# Patient Record
Sex: Male | Born: 1975 | Race: White | Hispanic: No | Marital: Married | Smoking: Never smoker
Health system: Southern US, Community
[De-identification: ages and names within clinical notes are randomized; demographics above are authoritative.]

## PROBLEM LIST (undated history)

## (undated) DIAGNOSIS — K219 Gastro-esophageal reflux disease without esophagitis: Secondary | ICD-10-CM

## (undated) HISTORY — DX: Gastro-esophageal reflux disease without esophagitis: K21.9

---

## 2015-03-03 ENCOUNTER — Emergency Department (HOSPITAL_BASED_OUTPATIENT_CLINIC_OR_DEPARTMENT_OTHER)
Admission: EM | Admit: 2015-03-03 | Discharge: 2015-03-03 | Disposition: A | Discharge status: Home / Self Care | Payer: BLUE CROSS/BLUE SHIELD | Attending: Emergency Medicine | Admitting: Emergency Medicine

## 2015-03-03 ENCOUNTER — Encounter (HOSPITAL_BASED_OUTPATIENT_CLINIC_OR_DEPARTMENT_OTHER): Payer: Self-pay | Admitting: *Deleted

## 2015-03-03 ENCOUNTER — Emergency Department (HOSPITAL_BASED_OUTPATIENT_CLINIC_OR_DEPARTMENT_OTHER): Payer: BLUE CROSS/BLUE SHIELD

## 2015-03-03 DIAGNOSIS — F17200 Nicotine dependence, unspecified, uncomplicated: Secondary | ICD-10-CM | POA: Insufficient documentation

## 2015-03-03 DIAGNOSIS — J069 Acute upper respiratory infection, unspecified: Secondary | ICD-10-CM | POA: Insufficient documentation

## 2015-03-03 DIAGNOSIS — B9789 Other viral agents as the cause of diseases classified elsewhere: Secondary | ICD-10-CM

## 2015-03-03 DIAGNOSIS — R05 Cough: Secondary | ICD-10-CM | POA: Diagnosis present

## 2015-03-03 DIAGNOSIS — R197 Diarrhea, unspecified: Secondary | ICD-10-CM | POA: Insufficient documentation

## 2015-03-03 DIAGNOSIS — R112 Nausea with vomiting, unspecified: Secondary | ICD-10-CM | POA: Insufficient documentation

## 2015-03-03 MED ORDER — ONDANSETRON 4 MG PO TBDP: 4.0000 mg | ORAL_TABLET | Freq: Three times a day (TID) | ORAL | Status: DC | PRN

## 2015-03-03 MED ORDER — OXYMETAZOLINE HCL 0.05 % NA SOLN: 1.0000 | Freq: Two times a day (BID) | NASAL | Status: DC

## 2015-03-03 MED ORDER — BENZONATATE 100 MG PO CAPS
200.0000 mg | ORAL_CAPSULE | Freq: Two times a day (BID) | ORAL | Status: DC | PRN
Start: 2015-03-03 — End: 2015-03-26

## 2015-03-03 NOTE — ED Provider Notes (Signed)
CSN: [id number]     Arrival date & time 03/03/15  1807 History   First MD Initiated Contact with Patient 03/03/15 2032     Chief Complaint  Patient presents with  . URI     (Consider location/radiation/quality/duration/timing/severity/associated sxs/prior Treatment) HPI   Patient is a 39 year old male with no past medical history presents to the emergency department with complaint of congestion, onset one week. Patient reports having subjective fever, nasal congestion, rhinorrhea, sore throat, productive cough, nausea, vomiting, and nonbloody diarrhea. Patient reports he has had a few episodes of coughing up blood, he he reports noticing a small amount of red streaked blood in his sputum. Denies headache, ear pain, wheezing, difficult to breathing, chest pain, abdominal pain, urinary symptoms. Patient states his symptoms started to improve over the past couple days however due to to his congestion not improving he decided to come to the emergency department tonight. Denies taking any medications at home prior to arrival.  History reviewed. No pertinent past medical history. History reviewed. No pertinent past surgical history. History reviewed. No pertinent family history. Social History  Substance Use Topics  . Smoking status: Current Every Day Smoker  . Smokeless tobacco: None  . Alcohol Use: No    Review of Systems  Constitutional: Positive for fever.  HENT: Positive for congestion, rhinorrhea and sore throat.   Respiratory: Positive for cough.   Gastrointestinal: Positive for nausea, vomiting and diarrhea.  All other systems reviewed and are negative.     Allergies  Review of patient's allergies indicates no known allergies.  Home Medications   Prior to Admission medications   Medication Sig Start Date End Date Taking? Authorizing Provider  benzonatate (TESSALON) 100 MG capsule Take 2 capsules (200 mg total) by mouth 2 (two) times daily as needed for cough. 03/03/15    Barrett HenleNicole Elizabeth Nadeau, PA-C  ondansetron (ZOFRAN ODT) 4 MG disintegrating tablet Take 1 tablet (4 mg total) by mouth every 8 (eight) hours as needed for nausea or vomiting. 03/03/15   Barrett HenleNicole Elizabeth Nadeau, PA-C  oxymetazoline (AFRIN NASAL SPRAY) 0.05 % nasal spray Place 1 spray into both nostrils 2 (two) times daily. 03/03/15   Satira SarkNicole Elizabeth Nadeau, PA-C   BP 126/79 mmHg  Pulse 79  Temp(Src) 98.2 F (36.8 C) (Oral)  Resp 16  Ht 6' (1.829 m)  Wt 127.007 kg  BMI 37.97 kg/m2  SpO2 99% Physical Exam  Constitutional: He is oriented to person, place, and time. He appears well-developed and well-nourished.  HENT:  Head: Normocephalic and atraumatic.  Right Ear: Tympanic membrane normal.  Left Ear: Tympanic membrane normal.  Nose: Rhinorrhea present. Right sinus exhibits no maxillary sinus tenderness and no frontal sinus tenderness. Left sinus exhibits no maxillary sinus tenderness and no frontal sinus tenderness.  Mouth/Throat: Uvula is midline, oropharynx is clear and moist and mucous membranes are normal. No oropharyngeal exudate, posterior oropharyngeal edema or posterior oropharyngeal erythema.  Eyes: Conjunctivae and EOM are normal. Pupils are equal, round, and reactive to light. Right eye exhibits no discharge. Left eye exhibits no discharge. No scleral icterus.  Neck: Normal range of motion. Neck supple.  Cardiovascular: Normal rate, regular rhythm, normal heart sounds and intact distal pulses.   Pulmonary/Chest: Effort normal and breath sounds normal. No respiratory distress. He has no wheezes. He has no rales. He exhibits no tenderness.  Abdominal: Soft. Bowel sounds are normal. He exhibits no distension and no mass. There is no tenderness. There is no rebound and no guarding.  Musculoskeletal: Normal  range of motion. He exhibits no edema.  Lymphadenopathy:    He has no cervical adenopathy.  Neurological: He is alert and oriented to person, place, and time.  Skin: Skin is warm  and dry.  Nursing note and vitals reviewed.   ED Course  Procedures (including critical care time) Labs Review Labs Reviewed - No data to display  Imaging Review Dg Chest 2 View  03/03/2015  CLINICAL DATA:  Acute onset of cough, congestion and fever. Initial encounter. EXAM: CHEST  2 VIEW COMPARISON:  None. FINDINGS: The lungs are well-aerated and clear. There is no evidence of focal opacification, pleural effusion or pneumothorax. The heart is normal in size; the mediastinal contour is within normal limits. No acute osseous abnormalities are seen. IMPRESSION: No acute cardiopulmonary process seen. Electronically Signed   By: Roanna Raider M.D.   On: 03/03/2015 22:21   I have personally reviewed and evaluated these images and lab results as part of my medical decision-making.   EKG Interpretation None      MDM   Final diagnoses:  Viral URI with cough    Pt presents with URI sxs with associated cough, endorses having 2-3 episodes of coughing up small amount of blood-streaked sputum. Denies taking any meds at home. VSS. Exam revealed rhinorrhea, otherwise unremarkable, lungs CTAB. CXR negative. I suspect pt's sxs are likely due to viral URI. Plan to d/c pt home with symptomatic tx. Pt given resource guide to follow up with PCP.  Evaluation does not show pathology requring ongoing emergent intervention or admission. Pt is hemodynamically stable and mentating appropriately. Discussed findings/results and plan with patient/guardian, who agrees with plan. All questions answered. Return precautions discussed and outpatient follow up given.      Satira Sark [place], [place] 03/04/15 0981  Loren Racer, MD 03/12/15 [phone]

## 2015-03-03 NOTE — ED Notes (Signed)
Pt c/o URi symptoms x 1 week  

## 2015-03-03 NOTE — Discharge Instructions (Signed)
Take your medications as prescribed. Please follow up with a primary care provider from the Resource Guide provided below in 3-4 days. Please return to the Emergency Department if symptoms worsen or new onset of fever, vomiting blood, coughing up blood, difficulty breathing, chest pain, blood in stool, abdominal pain.    Emergency Department Resource Guide 1) Find a Doctor and Pay Out of Pocket Although you won't have to find out who is covered by your insurance plan, it is a good idea to ask around and get recommendations. You will then need to call the office and see if the doctor you have chosen will accept you as a new patient and what types of options they offer for patients who are self-pay. Some doctors offer discounts or will set up payment plans for their patients who do not have insurance, but you will need to ask so you aren't surprised when you get to your appointment.  2) Contact Your Local Health Department Not all health departments have doctors that can see patients for sick visits, but many do, so it is worth a call to see if yours does. If you don't know where your local health department is, you can check in your phone book. The CDC also has a tool to help you locate your state's health department, and many state websites also have listings of all of their local health departments.  3) Find a Walk-in Clinic If your illness is not likely to be very severe or complicated, you may want to try a walk in clinic. These are popping up all over the country in pharmacies, drugstores, and shopping centers. They're usually staffed by nurse practitioners or physician assistants that have been trained to treat common illnesses and complaints. They're usually fairly quick and inexpensive. However, if you have serious medical issues or chronic medical problems, these are probably not your best option.  No Primary Care Doctor: - Call Health Connect at  [phone] - they can help you locate a primary  care doctor that  accepts your insurance, provides certain services, etc. - Physician Referral Service- [phone]  Chronic Pain Problems: Organization         Address  Phone   Notes  [facility]  [phone] Patients need to be referred by their primary care doctor.   Medication Assistance: Organization         Address  Phone   Notes  [facility] Medication [facility] [address] [place], [place] [address] [phone] --Must be a resident of [facility] [place] -- Must have NO insurance coverage whatsoever (no Medicaid/ Medicare, etc.) -- The pt. MUST have a primary care doctor that directs their care regularly and follows them in the community   MedAssist  [phone]   [employer]  [phone]    Agencies that provide inexpensive medical care: Organization         Address  Phone   Notes  [facility]  [phone]   [facility]    [phone]   [facility] [address] [place], [place] [address] [phone]   [facility] [address] [place], [place] [phone]   [facility]anned [facility]    [phone]   [facility]    [phone]   [facility]  [address], [place] Phone:  [phone], Fax:  [phone] Hours of Operation:  9 am - 6 pm, M-F.  Also accepts Medicaid/Medicare and self-pay.  [facility] [place], [address], [place] Phone: [phone], Fax: [phone]. Hours of Operation:  8:30 am - 5:30 pm, M-F.  Also accepts Medicaid and self-pay.  [facility] [place] [address] [place] Phone: [phone]   [place], [place], [place] [phone], Ext. [phone] Mondays & Thursdays: 7-9 AM.  First 15 patients are seen on a first come, first serve basis.    [place]  Providers:  Organization         Address  Phone   Notes  [facility] [address], [place] [phone] Also accepts self-pay patients.  [facility] [address], [place]  [phone]   [place], [address], [place] [phone]   [facility] [address] [place] [phone]   Lucianne Lei [address], [place]   [phone] Only accepts [place] Access [place] patients after they have their name applied to their card.   Self-Pay (no insurance) in [facility]:  Organization         Address  Phone   Notes  Sickle Cell Patients, [facility] [place] [phone]   [facility] [place] [phone]   [facility]  [place], [place],  [phone]   [facility]/Dr. Osei-Bonsu  [address] [place] or [address], [place] [phone] Phone number for both [place] and [place] locations is the same.  [facility] [address], [place] [phone]   [facility] [address] [place] or [address] [phone]   [facility] [address], [place] [phone], phone; [phone], fax Sees patients 1st and 3rd Saturday of every month.  Must not qualify for public or private insurance (i.e. Medicaid, Medicare, [place] Health Choice, Veterans' Benefits)  Household income should be no more than 200% of the poverty level The clinic cannot treat you if you are pregnant or think you are pregnant  Sexually transmitted diseases are not treated at the clinic.    Dental Care: Organization         Address  Phone  Notes  [facility] Department of [facility] [place] [phone] Accepts children up to age 12 who are enrolled in [place] or [place]; pregnant women with a Medicaid card; and children who have applied for Medicaid or [place] Health Choice, but were declined, whose parents can pay a reduced fee at time of service.  [facility] Department of [facility]  [address], [place] [phone] Accepts children up to age 24 who are enrolled in [place] or [place]; pregnant women with a Medicaid card; and children who have applied for Medicaid or [place] Health Choice, but were declined, whose parents can pay a reduced fee at time of service.  [facility] Adult [facility] Access PROGRAM  [place] [phone] Patients are seen by appointment only. Walk-ins are not accepted. [place] will see patients 18 years  of age and older. Monday - Tuesday (8am-5pm) Most Wednesdays (8:30-5pm) $30 per visit, cash only  [facility] Adult Dental Access PROGRAM  [address], [facility] [phone] Patients are seen by appointment only. Walk-ins are not accepted. [place] will see patients 43 years of age and older. One Wednesday Evening (Monthly: Volunteer Based).  $30 per visit, cash only  [place]  [phone] for adults; Children under age 81, call Graduate Pediatric Dentistry at [phone]. Children aged 70-14, please call [phone] to request a pediatric application.  Dental services are provided in all areas of dental care including fillings, crowns and bridges, complete and partial dentures, implants, gum treatment, root canals, and extractions. Preventive care is also provided. Treatment is provided to both adults and children. Patients are selected via a lottery and there is often a waiting list.   [facility] [address], [place]  [phone] [email or web address]   [facility]  [address] [place], [place] [phone], Ext. [phone] Second and Fourth Thursday of each month, opens at 6:30 AM; Clinic ends at 9 AM.  Patients are seen on a first-come first-served basis, and a limited number are seen during each clinic.   [facility]  [address] [place]ard Danker [place], [place] [phone]   Eligibility Requirements You must have lived in [place], [place], or [place] counties for at least the last three months.   You cannot be eligible for state or federal sponsored [employer], including [employer], [place], or [employer].   You generally cannot be eligible for healthcare insurance through your employer.    How to apply: Eligibility screenings are held every Tuesday and Wednesday afternoon from 1:00 pm until 4:00 pm. You do not need an appointment for the interview!  [facility] [address], [place], [place]   [place]  [place] Department  [place]  [phone]    Behavioral Health Resources in the Community: Intensive Outpatient Programs Organization         Address  Phone  Notes  [place]. [address] [place], [place] [phone]   [facility]patient [address] [place], [place]   ADS: Alcohol & Drug Svcs [address] [place], [place]   [place] [address],  [place], [place] or [phone]   Substance Abuse Resources Organization         Address  Phone  Notes  Alcohol and Drug Services  [phone]   [place]  [phone]   The [place]   Chinita Pester  [phone]   Residential & Outpatient Substance Abuse Program  [phone]   Psychological Services Organization         Address  Phone  Notes  [facility] [place]  [place]  [phone]   [place] [address] [place] or [phone]    Mobile Crisis Teams Organization         Address  Phone  Notes  [facility], Mobile Crisis Care Unit  [phone]   [facility]  [address] [place], [place]   [facility] [address] [place] [phone]    Self-Help/Support Groups Organization  Address  Phone             Notes  [place] of [place] - variety of support groups  [place] Call for more information  Narcotics Anonymous (NA), [facility] [address], [employer] [place]  2 meetings at this location   Special educational needs teacher         Address  Phone  Notes  ASAP Residential Treatment [place],    [place]  [phone]   [facility]  [address] [place] [address], [place], [place]   [place], [place] [phone] Admissions: 8am-3pm M-F  Incentives Substance [place] [address],    [place], [place] [phone]   [facility] [address] [place], [place], [place]   [facility] [address]  [place], [place]   [facility] - Intensive Outpatient [address], [place], [place]   [facility] ([place].) [address].,  [place], [place] [phone] or [phone]   [facility]idential Treatment Services (RTS) [address], [place], [place] Accepts Medicaid  [facility] [place] [address]  [place] [phone] Substance Abuse/Addiction Treatment   [facility] Organization         Address  Phone  Notes  [facility]  [phone]   Domenic Schwab, PhD [address] [place], [place]   [phone] or [phone]    [place], [place] [phone]   [facility] [address], [place], [place] [phone] Insurance/Medicaid/sponsorship through [facility] and Families [address], [address] [place]                                    [place], [place] [phone] [place] [address], [place] [phone]    Dr. Adele Schilder  [phone]   [facility] Dept. 1) [address] [place] 2) [place] 3)  [place] [address], [place] [phone]  [phone]   [place] [phone] or [phone] (After Hours)

## 2015-03-26 ENCOUNTER — Ambulatory Visit (INDEPENDENT_AMBULATORY_CARE_PROVIDER_SITE_OTHER): Payer: BLUE CROSS/BLUE SHIELD | Admitting: Family

## 2015-03-26 ENCOUNTER — Encounter: Payer: Self-pay | Admitting: Family

## 2015-03-26 VITALS — BP 130/88 | HR 79 | Temp 97.8°F | Resp 18 | Ht 71.75 in | Wt 280.0 lb

## 2015-03-26 DIAGNOSIS — R05 Cough: Secondary | ICD-10-CM

## 2015-03-26 DIAGNOSIS — R0683 Snoring: Secondary | ICD-10-CM | POA: Insufficient documentation

## 2015-03-26 DIAGNOSIS — R059 Cough, unspecified: Secondary | ICD-10-CM

## 2015-03-26 MED ORDER — PANTOPRAZOLE SODIUM 40 MG PO TBEC: 40.0000 mg | DELAYED_RELEASE_TABLET | Freq: Every day | ORAL | Status: DC

## 2015-03-26 NOTE — Patient Instructions (Signed)
Thank you for choosing [facility].  Summary/Instructions:  Your prescription(s) have been submitted to your pharmacy or been printed and provided for you. Please take as directed and contact our office if you believe you are having problem(s) with the medication(s) or have any questions.  If your symptoms worsen or fail to improve, please contact our office for further instruction, or in case of emergency go directly to the emergency room at the closest medical facility.    Cough, Adult Coughing is a reflex that clears your throat and your airways. Coughing helps to heal and protect your lungs. It is normal to cough occasionally, but a cough that happens with other symptoms or lasts a long time may be a sign of a condition that needs treatment. A cough may last only 2-3 weeks (acute), or it may last longer than 8 weeks (chronic). CAUSES Coughing is commonly caused by:  Breathing in substances that irritate your lungs.  A viral or bacterial respiratory infection.  Allergies.  Asthma.  Postnasal drip.  Smoking.  Acid backing up from the stomach into the esophagus (gastroesophageal reflux).  Certain medicines.  Chronic lung problems, including COPD (or rarely, lung cancer).  Other medical conditions such as heart failure. HOME CARE INSTRUCTIONS  Pay attention to any changes in your symptoms. Take these actions to help with your discomfort:  Take medicines only as told by your health care provider.  If you were prescribed an antibiotic medicine, take it as told by your health care provider. Do not stop taking the antibiotic even if you start to feel better.  Talk with your health care provider before you take a cough suppressant medicine.  Drink enough fluid to keep your urine clear or pale yellow.  If the air is dry, use a cold steam vaporizer or humidifier in your bedroom or your home to help loosen secretions.  Avoid anything that causes you to cough at work or at  home.  If your cough is worse at night, try sleeping in a semi-upright position.  Avoid cigarette smoke. If you smoke, quit smoking. If you need help quitting, ask your health care provider.  Avoid caffeine.  Avoid alcohol.  Rest as needed. SEEK MEDICAL CARE IF:   You have new symptoms.  You cough up pus.  Your cough does not get better after 2-3 weeks, or your cough gets worse.  You cannot control your cough with suppressant medicines and you are losing sleep.  You develop pain that is getting worse or pain that is not controlled with pain medicines.  You have a fever.  You have unexplained weight loss.  You have night sweats. SEEK IMMEDIATE MEDICAL CARE IF:  You cough up blood.  You have difficulty breathing.  Your heartbeat is very fast.   This information is not intended to replace advice given to you by your health care provider. Make sure you discuss any questions you have with your health care provider.   Document Released: 08/21/2010 Document Revised: 11/13/2014 Document Reviewed: 05/01/2014 Elsevier Interactive Patient Education 2016 Elsevier Inc.  

## 2015-03-26 NOTE — Progress Notes (Signed)
Subjective:    Patient ID: Anthony Coleman, male    DOB: 10-May-1975, 40 y.o.   MRN: [id number]  Chief Complaint  Patient presents with  . Establish Care    Feels like something is wrong with his throat or lungs, cough x1 year has some SOB    HPI:  Anthony Coleman is a 40 y.o. male who  has a past medical history of GERD (gastroesophageal reflux disease). and presents today for an office visit to establish care.   Associated symptom of a cough has been going on for about 1 year. Worsened by talking for long periods of time. Modifying factors include a cough syrup with codiene that has helped with the cough, but Tessalon has not helped. Frequency of cough is fairly common throughout the day. Has also used his wife's albuterol inhaler which he said made him sick. Cough is described as productive on occasion. Denies fevers. Timing of symptoms is worsened at night. He has been told that he does snore at night and may stop breathing at points when he sleeps.  No Known Allergies   Outpatient Prescriptions Prior to Visit  Medication Sig Dispense Refill  . benzonatate (TESSALON) 100 MG capsule Take 2 capsules (200 mg total) by mouth 2 (two) times daily as needed for cough. 20 capsule 0  . ondansetron (ZOFRAN ODT) 4 MG disintegrating tablet Take 1 tablet (4 mg total) by mouth every 8 (eight) hours as needed for nausea or vomiting. 10 tablet 0  . oxymetazoline (AFRIN NASAL SPRAY) 0.05 % nasal spray Place 1 spray into both nostrils 2 (two) times daily. 30 mL 0   No facility-administered medications prior to visit.     Past Medical History  Diagnosis Date  . GERD (gastroesophageal reflux disease)      History reviewed. No pertinent past surgical history.   Family History  Problem Relation Age of Onset  . Alcohol abuse Mother   . Alcohol abuse Father   . Diabetes Maternal Grandmother   . Diabetes Maternal Grandfather      Social History   Social History  . Marital Status: Married      Spouse Name: N/A  . Number of Children: 5  . Years of Education: 14   Occupational History  . HVAC    Social History Main Topics  . Smoking status: Never Smoker   . Smokeless tobacco: Current User    Types: Snuff  . Alcohol Use: No  . Drug Use: No  . Sexual Activity: No   Other Topics Concern  . Not on file   Social History Narrative   Fun: Coach football, baseball, basketball     Review of Systems  Constitutional: Negative for fever and chills.  Respiratory: Positive for cough and shortness of breath. Negative for chest tightness and wheezing.   Cardiovascular: Negative for chest pain, palpitations and leg swelling.      Objective:    BP 130/88 mmHg  Pulse 79  Temp(Src) 97.8 F (36.6 C) (Oral)  Resp 18  Ht 5' 11.75" (1.822 m)  Wt 280 lb (127.007 kg)  BMI 38.26 kg/m2  SpO2 97% Nursing note and vital signs reviewed.  Physical Exam  Constitutional: He is oriented to person, place, and time. He appears well-developed and well-nourished. No distress.  HENT:  Right Ear: Hearing, tympanic membrane, external ear and ear canal normal.  Left Ear: Hearing, tympanic membrane, external ear and ear canal normal.  Nose: Nose normal.  Mouth/Throat: Uvula is midline, oropharynx is  clear and moist and mucous membranes are normal.  Cardiovascular: Normal rate, regular rhythm, normal heart sounds and intact distal pulses.   Pulmonary/Chest: Effort normal and breath sounds normal. No respiratory distress. He has no wheezes. He has no rales. He exhibits no tenderness.  Neurological: He is alert and oriented to person, place, and time.  Skin: Skin is warm and dry.  Psychiatric: He has a normal mood and affect. His behavior is normal. Judgment and thought content normal.       Assessment & Plan:   Problem List Items Addressed This Visit      Other   Cough - Primary    Cough of questionable origin between possible gastroesophageal reflux or asthma. Unlikely to be bacterial  given length of time and no fevers. Not currently on medications that would result and cough. Is exposed to secondhand smoke and does work in [employer] area. Start Protonix to cover for reflux. Obtain pulmonary function tests to rule out underlying obstructive airway disease. Follow-up pending results of primary function tests or if symptoms do not improve with medication regimen.      Relevant Medications   pantoprazole (PROTONIX) 40 MG tablet   Other Relevant Orders   Pulmonary function test   Snoring    Unlikely cough related, however does experience snoring and possible apneic periods during sleep with concern for sleep apnea. Patient declines testing at this time. Discussed importance of addressing weight loss as patient has a BMI of 38. Follow-up if symptoms worsen or wishes to pursue further treatment.

## 2015-03-26 NOTE — Assessment & Plan Note (Signed)
Cough of questionable origin between possible gastroesophageal reflux or asthma. Unlikely to be bacterial given length of time and no fevers. Not currently on medications that would result and cough. Is exposed to secondhand smoke and does work in [employer] area. Start Protonix to cover for reflux. Obtain pulmonary function tests to rule out underlying obstructive airway disease. Follow-up pending results of primary function tests or if symptoms do not improve with medication regimen.

## 2015-03-26 NOTE — Assessment & Plan Note (Signed)
Unlikely cough related, however does experience snoring and possible apneic periods during sleep with concern for sleep apnea. Patient declines testing at this time. Discussed importance of addressing weight loss as patient has a BMI of 38. Follow-up if symptoms worsen or wishes to pursue further treatment.

## 2015-03-26 NOTE — Progress Notes (Signed)
Pre visit review using our clinic review tool, if applicable. No additional management support is needed unless otherwise documented below in the visit note. 

## 2015-06-02 ENCOUNTER — Ambulatory Visit: Payer: BLUE CROSS/BLUE SHIELD | Admitting: Family

## 2015-06-25 ENCOUNTER — Encounter (HOSPITAL_COMMUNITY): Payer: Self-pay | Admitting: Emergency Medicine

## 2015-06-25 ENCOUNTER — Emergency Department (HOSPITAL_COMMUNITY): Payer: BLUE CROSS/BLUE SHIELD

## 2015-06-25 DIAGNOSIS — R0602 Shortness of breath: Secondary | ICD-10-CM | POA: Insufficient documentation

## 2015-06-25 DIAGNOSIS — R079 Chest pain, unspecified: Secondary | ICD-10-CM | POA: Diagnosis not present

## 2015-06-25 DIAGNOSIS — R197 Diarrhea, unspecified: Secondary | ICD-10-CM | POA: Insufficient documentation

## 2015-06-25 DIAGNOSIS — R05 Cough: Secondary | ICD-10-CM | POA: Insufficient documentation

## 2015-06-25 LAB — CBC
HEMATOCRIT: 42 % (ref 39.0–52.0)
Hemoglobin: 13.7 g/dL (ref 13.0–17.0)
MCH: 29.3 pg (ref 26.0–34.0)
MCHC: 32.6 g/dL (ref 30.0–36.0)
MCV: 89.7 fL (ref 78.0–100.0)
Platelets: 389 10*3/uL (ref 150–400)
RBC: 4.68 MIL/uL (ref 4.22–5.81)
RDW: 13.7 % (ref 11.5–15.5)
WBC: 7.5 10*3/uL (ref 4.0–10.5)

## 2015-06-25 LAB — I-STAT TROPONIN, ED: Troponin i, poc: 0 ng/mL (ref 0.00–0.08)

## 2015-06-25 LAB — BASIC METABOLIC PANEL
Anion gap: 10 (ref 5–15)
BUN: 11 mg/dL (ref 6–20)
CHLORIDE: 107 mmol/L (ref 101–111)
CO2: 24 mmol/L (ref 22–32)
Calcium: 9.4 mg/dL (ref 8.9–10.3)
Creatinine, Ser: 0.92 mg/dL (ref 0.61–1.24)
GFR calc Af Amer: >60 mL/min (ref >60)
GFR calc non Af Amer: >60 mL/min (ref >60)
GLUCOSE: 81 mg/dL (ref 65–99)
POTASSIUM: 4.1 mmol/L (ref 3.5–5.1)
Sodium: 141 mmol/L (ref 135–145)

## 2015-06-25 NOTE — ED Notes (Signed)
Pt. reports intermittent central chest pressure with SOB , productive cough and diarrhea onset this morning . No chest pain at triage .

## 2015-06-26 ENCOUNTER — Emergency Department (HOSPITAL_COMMUNITY)
Admission: EM | Admit: 2015-06-26 | Discharge: 2015-06-26 | Disposition: A | Discharge status: Home / Self Care | Payer: BLUE CROSS/BLUE SHIELD | Attending: Emergency Medicine | Admitting: Emergency Medicine

## 2015-06-26 NOTE — ED Notes (Signed)
Pt stated that he is leaving. Pt ambulated out of the waiting area

## 2015-08-14 ENCOUNTER — Encounter: Payer: Self-pay | Admitting: Family

## 2015-08-14 ENCOUNTER — Ambulatory Visit (INDEPENDENT_AMBULATORY_CARE_PROVIDER_SITE_OTHER): Payer: BLUE CROSS/BLUE SHIELD | Admitting: Family

## 2015-08-14 VITALS — BP 140/80 | HR 102 | Temp 98.8°F | Ht 71.0 in | Wt 283.2 lb

## 2015-08-14 DIAGNOSIS — H65193 Other acute nonsuppurative otitis media, bilateral: Secondary | ICD-10-CM | POA: Diagnosis not present

## 2015-08-14 DIAGNOSIS — J40 Bronchitis, not specified as acute or chronic: Secondary | ICD-10-CM | POA: Diagnosis not present

## 2015-08-14 DIAGNOSIS — J209 Acute bronchitis, unspecified: Secondary | ICD-10-CM

## 2015-08-14 MED ORDER — AMOXICILLIN 500 MG PO CAPS: 500.0000 mg | ORAL_CAPSULE | Freq: Two times a day (BID) | ORAL | Status: DC

## 2015-08-14 MED ORDER — HYDROCODONE-HOMATROPINE 5-1.5 MG/5ML PO SYRP: 5.0000 mL | ORAL_SOLUTION | Freq: Every evening | ORAL | Status: DC | PRN

## 2015-08-14 MED ORDER — ALBUTEROL SULFATE (2.5 MG/3ML) 0.083% IN NEBU: 2.5000 mg | INHALATION_SOLUTION | Freq: Once | RESPIRATORY_TRACT | Status: DC

## 2015-08-14 NOTE — Progress Notes (Signed)
Pre visit review using our clinic review tool, if applicable. No additional management support is needed unless otherwise documented below in the visit note. 

## 2015-08-14 NOTE — Patient Instructions (Signed)
Use albuterol every 6 hours for first 24 hours to get good medication into the lungs and loosen congestion; after, you may use as needed and eventually stop all together when cough resolves.  Please take cough medication at night only as needed. As we discussed, I do not recommend dosing throughout the day as coughing is a protective mechanism . It also helps to break up thick mucous.  Do not take cough suppressants with alcohol as can lead to trouble breathing. Advise caution if taking cough suppressant and operating machinery ( i.e driving a car) as you may feel very tired.    Please take cough medication at night only as needed. As we discussed, I do not recommend dosing throughout the day as coughing is a protective mechanism . It also helps to break up thick mucous.  Do not take cough suppressants with alcohol as can lead to trouble breathing. Advise caution if taking cough suppressant and operating machinery ( i.e driving a car) as you may feel very tired.

## 2015-08-14 NOTE — Progress Notes (Signed)
Subjective:    Patient ID: Anthony Coleman, male    DOB: 04/22/1975, 40 y.o.   MRN: [id number]   Anthony Coleman is a 40 y.o. male who presents today for an acute visit.    HPI Comments: Patient here for evaluation of productive cough for 3 weeks. States 2 weeks ago, he was diagnosed with flu at a [facility]  and treated with Tamiflu without resolve. Called clinic back when symptoms hadnt improved and started on prednisone, albuterol. Stopped prednisone as felt 'too jacked up' and also stopped inhaler as thought it made him nauseated. Endorses sinus pressure, wheezing, ears hurt, sore throat. Tried Nyquil without relief. No h/o asthma however 2 of his children have asthma. Notes aside from this acute exacerbation, he has had chronic cough for some time. He trialed Protonix as thought the symptoms may be GERD related, however it did not help. He thought he was to see pulmonology however that never came to fruition.  Past Medical History  Diagnosis Date  . GERD (gastroesophageal reflux disease)    Allergies: Review of patient's allergies indicates no known allergies. Current Outpatient Prescriptions on File Prior to Visit  Medication Sig Dispense Refill  . pantoprazole (PROTONIX) 40 MG tablet Take 1 tablet (40 mg total) by mouth daily. 30 tablet 0   No current facility-administered medications on file prior to visit.    Social History  Substance Use Topics  . Smoking status: Never Smoker   . Smokeless tobacco: Current User    Types: Snuff  . Alcohol Use: No    Review of Systems  Constitutional: Negative for fever and chills.  HENT: Positive for congestion, ear pain, sinus pressure and sore throat.   Respiratory: Positive for cough. Negative for shortness of breath and wheezing.   Cardiovascular: Negative for chest pain and palpitations.  Gastrointestinal: Negative for nausea and vomiting.      Objective:    BP 140/80 mmHg  Pulse 102  Temp(Src) 98.8 F (37.1 C) (Oral)   Ht 5\' 11"  (1.803 m)  Wt 283 lb 3 oz (128.453 kg)  BMI 39.51 kg/m2  SpO2 97%   Physical Exam  Constitutional: Vital signs are normal. He appears well-developed and well-nourished.  HENT:  Head: Normocephalic and atraumatic.  Right Ear: Hearing, tympanic membrane, external ear and ear canal normal. No drainage, swelling or tenderness. Tympanic membrane is not injected, not erythematous and not bulging. No middle ear effusion. No decreased hearing is noted.  Left Ear: Hearing, tympanic membrane, external ear and ear canal normal. No drainage, swelling or tenderness. Tympanic membrane is not injected, not erythematous and not bulging.  No middle ear effusion. No decreased hearing is noted.  Nose: Nose normal. Right sinus exhibits no maxillary sinus tenderness and no frontal sinus tenderness. Left sinus exhibits no maxillary sinus tenderness and no frontal sinus tenderness.  Mouth/Throat: Uvula is midline, oropharynx is clear and moist and mucous membranes are normal. No oropharyngeal exudate, posterior oropharyngeal edema, posterior oropharyngeal erythema or tonsillar abscesses.  Eyes: Conjunctivae are normal.  Cardiovascular: Regular rhythm and normal heart sounds.   Pulmonary/Chest: Effort normal. No respiratory distress. He has decreased breath sounds in the right lower field and the left lower field. He has no wheezes. He has no rhonchi. He has no rales.  Lymphadenopathy:       Head (right side): No submental, no submandibular, no tonsillar, no preauricular, no posterior auricular and no occipital adenopathy present.       Head (left side): No  submental, no submandibular, no tonsillar, no preauricular, no posterior auricular and no occipital adenopathy present.    He has no cervical adenopathy.  Neurological: He is alert.  Skin: Skin is warm and dry.  Psychiatric: He has a normal mood and affect. His speech is normal and behavior is normal.  Vitals reviewed. Patient felt significantly better  after albuterol treatment. Lung sounds clear and increased      Assessment & Plan:   1. Bronchitis with bronchospasm Working diagnosis of bronchitis however patient may have underlying asthma. He is much better after nebulizer treatment and his cough already improved since being in the exam room. Due to the chronicity of his cough, I have placed a referral for pulmonology for further evaluation. Cough syrup at bedtime.   - albuterol (PROVENTIL) (2.5 MG/3ML) 0.083% nebulizer solution 2.5 mg; Take 3 mLs (2.5 mg total) by nebulization once. - HYDROcodone-homatropine (HYCODAN) 5-1.5 MG/5ML syrup; Take 5 mLs by mouth at bedtime as needed for cough.  Dispense: 40 mL; Refill: 0  2. Acute nonsuppurative otitis media of both ears - amoxicillin (AMOXIL) 500 MG capsule; Take 1 capsule (500 mg total) by mouth 2 (two) times daily.  Dispense: 14 capsule; Refill: 0    I am having [name] maintain his pantoprazole and PROAIR HFA.   Meds ordered this encounter  Medications  . PROAIR HFA 108 (90 Base) MCG/ACT inhaler    Sig: inhale 1 to 2 puffs every 4 to 6 hours if needed for wheezing    Refill:  0     Start medications as prescribed and explained to patient on After Visit Summary ( AVS). Risks, benefits, and alternatives of the medications and treatment plan prescribed today were discussed, and patient expressed understanding.   Education regarding symptom management and diagnosis given to patient.   Follow-up:Plan follow-up and return precautions given if any worsening symptoms or change in condition.   Continue to follow with Jeanine Luz, FNP for routine health maintenance.   Anthony Coleman and I agreed with plan.   Rennie Plowman, FNP

## 2015-08-20 ENCOUNTER — Telehealth: Payer: Self-pay | Admitting: Family

## 2015-08-20 NOTE — Telephone Encounter (Signed)
Pt called in and said that he is not getting any better and the appt that was made up in [place] is not til July.  Is there anything else that can be done.  Nothing is coming up when he coughs anymore , he just dry coughs until he throws up.    Best number [phone]

## 2015-08-20 NOTE — Telephone Encounter (Signed)
Please call patient for more info:   Is he having shortness of breath, wheezing? Is he having fever, chills? He is being treated with antibiotic (which he'll finish probably today or tomorrow) if no improvement, I suspect viral process.   Is he using his inhaler?   If symptoms are acutely worsening, please advised patient to make an appointment to be seen.

## 2015-08-21 ENCOUNTER — Encounter: Payer: Self-pay | Admitting: Pulmonary Disease

## 2015-08-21 ENCOUNTER — Ambulatory Visit (INDEPENDENT_AMBULATORY_CARE_PROVIDER_SITE_OTHER): Payer: BLUE CROSS/BLUE SHIELD | Admitting: Pulmonary Disease

## 2015-08-21 VITALS — BP 132/98 | HR 90 | Ht 71.0 in | Wt 280.0 lb

## 2015-08-21 DIAGNOSIS — G4710 Hypersomnia, unspecified: Secondary | ICD-10-CM | POA: Diagnosis not present

## 2015-08-21 DIAGNOSIS — R05 Cough: Secondary | ICD-10-CM

## 2015-08-21 DIAGNOSIS — R059 Cough, unspecified: Secondary | ICD-10-CM

## 2015-08-21 DIAGNOSIS — E669 Obesity, unspecified: Secondary | ICD-10-CM | POA: Diagnosis not present

## 2015-08-21 MED ORDER — FLUTICASONE PROPIONATE 50 MCG/ACT NA SUSP: 2.0000 | Freq: Every day | NASAL | Status: DC

## 2015-08-21 MED ORDER — PREDNISONE 10 MG PO TABS: ORAL_TABLET | ORAL | Status: DC

## 2015-08-21 MED ORDER — AEROCHAMBER MV MISC: Status: DC

## 2015-08-21 MED ORDER — PANTOPRAZOLE SODIUM 40 MG PO TBEC: 40.0000 mg | DELAYED_RELEASE_TABLET | Freq: Two times a day (BID) | ORAL | Status: DC

## 2015-08-21 MED ORDER — MONTELUKAST SODIUM 10 MG PO TABS: 10.0000 mg | ORAL_TABLET | Freq: Every day | ORAL | Status: DC

## 2015-08-21 MED ORDER — BUDESONIDE-FORMOTEROL FUMARATE 160-4.5 MCG/ACT IN AERO: 2.0000 | INHALATION_SPRAY | Freq: Two times a day (BID) | RESPIRATORY_TRACT | Status: DC

## 2015-08-21 NOTE — Assessment & Plan Note (Signed)
Pt with hypersomnia, snoring, witnessed apneas per wife. Will need a PSG once cough is better.

## 2015-08-21 NOTE — Assessment & Plan Note (Signed)
Weight reduction 

## 2015-08-21 NOTE — Assessment & Plan Note (Addendum)
We extensively discussed the diagnosis of cough and possible triggers for cough.  Cough started as post infectious cough but has lingered. Pt with chronic cough.  Now, it is behaving like a "cough variant asthma" or hyperreactive airway dse with GERD as well. Some sinus issues.   Cough is most likely multifactorial. Likely related to the following: A. Upper Airway Cough Syndrome  Plan to start Flonase 2 squirts per nostril at HS  Plan to start Singulair 10 mg/tab, 1 tab at HS B. GERD  Plan to start PPI 1 tab BID  Advised on dietary changes.He eats a lot of tomato and pizza which can make GERD worse.   Try to keep HOB 30 degrees elevated. C. Asthma-type reaction, allergies  Plan to start inhaled steroids/LABA > Symbicort 160/4.5 2P BID with a spacer. Use alb prn.   Plan to start prednisone at a lower dose of 20 mg/d x 7 d then 10 mg/d x 5-7 days.   Will consider neb meds if symbicort makes him cough.   Told wife to stop smoking at least for 1 week and see if cough gets better. Told pt to change his diet > avoid tomatoes, pizza, cheese, etc which can make GERD worse.  Told pt to call in 1 week if cough is not better. If he gets better, plan to taper off meds. May also try neb meds if symbicort makes him cough.  Try Delsym 1-2 teaspoon BID.    CXR in 02/2015 and 06/2015 has (-) infiltrate, some prominent int markings likely 2/2 bronchitis.   May need a chest ct scan if not better. R/O underlying parenchymal dse but less likely.  May need a bronch if not better.  Will need allergy test (RAST, allergy profile) if not better.

## 2015-08-21 NOTE — Progress Notes (Signed)
Subjective:    Patient ID: Anthony Coleman, male    DOB: [birth year], 40 y.o.   MRN: [id number]  HPI   This is the case of Anthony Coleman, 40 y.o. Male, who was referred by Eartha Inch in consultation regarding cough.   As you very well know, patient is a non smoker. Not known to have asthma or copd or cough.  He started with a cough in Christmas time. Had a viral infection with cough, colds, congestion. Sx improved except for cough. Cough lingered.  He was given Tessalon perles and cough syrup w/o much improvement.  He then got ZPAK in 03/2015.  By March time, he saw his PCP again and was given a PPI which he took for 5 days w/o much effect. Cough lingered. During [facility], it seemed he had another bout of bronchitis making the cough worse.  He went to urgent care. Was given Tamiflu, Proair, cough meds. Not better. He saw his PCP and was given PO Prednisone. Prednisone made him moody.  Cough not better so he ended up getting Amoxicilllin x 1 week.  During this time, he had some vomiting 2/2 cough.  Triggers for cough: heat.  He coughs day and night.  Denies any changes in environment. His boss was smoking cigarettes but he had quit 1-2 mos and cough is persistent.  Wife is smoking.  He has 2 kids and none of them got sick during the time of the cough. His house is clean. Has lived there x 2 yrs. (-) pets, birds, molds, stagnant water.  No new meds or perfumes or new something for which he might be allergic too.  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  No recent travel.   He has gained weight the last 6 mos. Maybe 5-10 lbs. Some sinus issues. Some GERD sx.   Has snoring, witnessed apneas, unrefreshed sleep per wife.   Review of Systems  Constitutional: Negative.  Negative for fever and unexpected weight change.  HENT: Positive for congestion and sore throat. Negative for dental problem, ear pain, nosebleeds, postnasal drip, rhinorrhea, sinus pressure,  sneezing and trouble swallowing.   Eyes: Negative.  Negative for redness and itching.  Respiratory: Positive for cough and shortness of breath. Negative for chest tightness and wheezing.   Cardiovascular: Negative.  Negative for palpitations and leg swelling.  Gastrointestinal: Positive for vomiting. Negative for nausea.  Endocrine: Negative.   Genitourinary: Negative.  Negative for dysuria.  Musculoskeletal: Positive for myalgias and arthralgias. Negative for joint swelling.  Skin: Negative.  Negative for rash.  Allergic/Immunologic: Negative.   Neurological: Positive for light-headedness and headaches.  Hematological: Negative.  Does not bruise/bleed easily.  Psychiatric/Behavioral: Negative.  Negative for dysphoric mood. The patient is not nervous/anxious.   All other systems reviewed and are negative.  Past Medical History  Diagnosis Date  . GERD (gastroesophageal reflux disease)    (-) DVT, CA  Family History  Problem Relation Age of Onset  . Alcohol abuse Mother   . Alcohol abuse Father   . Diabetes Maternal Grandmother   . Diabetes Maternal Grandfather      No past surgical history on file.    Social History   Social History  . Marital Status: Married    Spouse Name: N/A  . Number of Children: 5  . Years of Education: 14   Occupational History  . HVAC    Social History Main Topics  . Smoking status: Never Smoker   .  Smokeless tobacco: Current User    Types: Snuff  . Alcohol Use: No  . Drug Use: No  . Sexual Activity: No   Other Topics Concern  . Not on file   Social History Narrative   Fun: Psychologist, occupationalCoach football, baseball, basketball     No Known Allergies   Outpatient Prescriptions Prior to Visit  Medication Sig Dispense Refill  . amoxicillin (AMOXIL) 500 MG capsule Take 1 capsule (500 mg total) by mouth 2 (two) times daily. 14 capsule 0  . PROAIR HFA 108 (90 Base) MCG/ACT inhaler inhale 1 to 2 puffs every 4 to 6 hours if needed for wheezing  0  .  HYDROcodone-homatropine (HYCODAN) 5-1.5 MG/5ML syrup Take 5 mLs by mouth at bedtime as needed for cough. 40 mL 0  . pantoprazole (PROTONIX) 40 MG tablet Take 1 tablet (40 mg total) by mouth daily. 30 tablet 0   Facility-Administered Medications Prior to Visit  Medication Dose Route Frequency Provider Last Rate Last Dose  . albuterol (PROVENTIL) (2.5 MG/3ML) 0.083% nebulizer solution 2.5 mg  2.5 mg Nebulization Once Allegra GranaMargaret G Arnett, FNP       Meds ordered this encounter  Medications  . budesonide-formoterol (SYMBICORT) 160-4.5 MCG/ACT inhaler    Sig: Inhale 2 puffs into the lungs 2 (two) times daily.    Dispense:  1 Inhaler    Refill:  11  . Spacer/Aero-Holding Chambers (AEROCHAMBER MV) inhaler    Sig: Use as instructed    Dispense:  1 each    Refill:  0  . predniSONE (DELTASONE) 10 MG tablet    Sig: TAKE AS DIRECTED    Dispense:  60 tablet    Refill:  0  . pantoprazole (PROTONIX) 40 MG tablet    Sig: Take 1 tablet (40 mg total) by mouth 2 (two) times daily.    Dispense:  60 tablet    Refill:  11  . fluticasone (FLONASE) 50 MCG/ACT nasal spray    Sig: Place 2 sprays into both nostrils daily.    Dispense:  16 g    Refill:  11  . montelukast (SINGULAIR) 10 MG tablet    Sig: Take 1 tablet (10 mg total) by mouth at bedtime.    Dispense:  30 tablet    Refill:  11          Objective:   Physical Exam   Vitals:  Filed Vitals:   08/21/15 1412  BP: 132/98  Pulse: 90  Height: 5\' 11"  (1.803 m)  Weight: 280 lb (127.007 kg)  SpO2: 95%    Constitutional/General:  Pleasant, well-nourished, well-developed, not in any distress,  Comfortably seating.  Well kempt. Coughing hard while being examined.   Body mass index is 39.07 kg/(m^2). Wt Readings from Last 3 Encounters:  08/21/15 280 lb (127.007 kg)  08/14/15 283 lb 3 oz (128.453 kg)  06/25/15 280 lb (127.007 kg)      HEENT: Pupils equal and reactive to light and accommodation. Anicteric sclerae. Normal nasal mucosa.     No oral  lesions,  mouth clear,  oropharynx clear, no postnasal drip. (-) Oral thrush. No dental caries.  Airway - Mallampati class IV  Neck: No masses. Midline trachea. No JVD, (-) LAD. (-) bruits appreciated.  Respiratory/Chest: Grossly normal chest. (-) deformity. (-) Accessory muscle use.  Symmetric expansion. (-) Tenderness on palpation.  Resonant on percussion.  Diminished BS on both lower lung zones. (-)crackles, rhonchi. Occasional wheezing (-) egophony  Cardiovascular: Regular rate and  rhythm, heart sounds  normal, no murmur or gallops, no peripheral edema  Gastrointestinal:  Normal bowel sounds. Soft, non-tender. No hepatosplenomegaly.  (-) masses.   Musculoskeletal:  Normal muscle tone. Normal gait.   Extremities: Grossly normal. (-) clubbing, cyanosis.  (-) edema  Skin: (-) rash,lesions seen.   Neurological/Psychiatric : alert, oriented to time, place, person. Normal mood and affect          Assessment & Plan:  Cough We extensively discussed the diagnosis of cough and possible triggers for cough.  Cough started as post infectious cough but has lingered. Pt with chronic cough.  Now, it is behaving like a "cough variant asthma" or hyperreactive airway dse with GERD as well. Some sinus issues.   Cough is most likely multifactorial. Likely related to the following: A. Upper Airway Cough Syndrome  Plan to start Flonase 2 squirts per nostril at HS  Plan to start Singulair 10 mg/tab, 1 tab at HS B. GERD  Plan to start PPI 1 tab BID  Advised on dietary changes.He eats a lot of tomato and pizza which can make GERD worse.   Try to keep HOB 30 degrees elevated. C. Asthma-type reaction, allergies  Plan to start inhaled steroids/LABA > Symbicort 160/4.5 2P BID with a spacer. Use alb prn.   Plan to start prednisone at a lower dose of 20 mg/d x 7 d then 10 mg/d x 5-7 days.   Will consider neb meds if symbicort makes him cough.   Told wife to stop smoking at least  for 1 week and see if cough gets better. Told pt to change his diet > avoid tomatoes, pizza, cheese, etc which can make GERD worse.  Told pt to call in 1 week if cough is not better. If he gets better, plan to taper off meds. May also try neb meds if symbicort makes him cough.  Try Delsym 1-2 teaspoon BID.    CXR in 02/2015 and 06/2015 has (-) infiltrate, some prominent int markings likely 2/2 bronchitis.   May need a chest ct scan if not better. R/O underlying parenchymal dse but less likely.  May need a bronch if not better.  Will need allergy test (RAST, allergy profile) if not better.   Hypersomnia Pt with hypersomnia, snoring, witnessed apneas per wife. Will need a PSG once cough is better.   Obesity Weight reduction.      I personally reviewed previous images (Chest Xray, Chest Ct scan) done on this patient. I reviewed the reports on the images as well.    Thank you very much for letting me participate in this patient's care. Please do not hesitate to give me a call if you have any questions or concerns regarding the treatment plan.   Patient will follow up with me in 4-6 weeks.     Pollie Meyer, MD 08/21/2015   10:51 PM Pulmonary and Critical Care Medicine [facility] Pager: [phone] Office: [phone], Fax: [phone]

## 2015-08-21 NOTE — Patient Instructions (Signed)
It was a pleasure taking care of you today!  Your cough is most likely multifactorial. Likely related to the following: A. Upper Airway Cough Syndrome  Start Flonase 2 squirts per nostril at HS  Start Singulair 10 mg/tab, 1 tab at HS B. GERD  Start Protonix 1 tab 2x/day  Advised on dietary changes. Avoid food that will make your reflux worse.   Try to keep head of bed 30 degrees elevated.  Wait 2 hours at least after your last meal before you lie down C. Asthma-type reaction, allergies  Start Symbicort 160/4.5 2 puffs, 2x/d. Use spacer. Cont alb 2 puffs every 4 hrs as needed.              Prednisone 20 mg/d as we had discussed.   We will try this plan and see if your cough gets better. Please call the office if your cough worsens while on treatment.    Return to clinic in 4-6 weeks with Dr. Christene Slatese Dios or NP

## 2015-09-18 ENCOUNTER — Encounter: Payer: Self-pay | Admitting: Acute Care

## 2015-09-18 ENCOUNTER — Ambulatory Visit (INDEPENDENT_AMBULATORY_CARE_PROVIDER_SITE_OTHER): Payer: BLUE CROSS/BLUE SHIELD | Admitting: Acute Care

## 2015-09-18 ENCOUNTER — Telehealth: Payer: Self-pay | Admitting: Pulmonary Disease

## 2015-09-18 ENCOUNTER — Other Ambulatory Visit: Payer: BLUE CROSS/BLUE SHIELD

## 2015-09-18 VITALS — BP 104/82 | HR 91 | Ht 72.0 in | Wt 288.2 lb

## 2015-09-18 DIAGNOSIS — R059 Cough, unspecified: Secondary | ICD-10-CM

## 2015-09-18 DIAGNOSIS — R05 Cough: Secondary | ICD-10-CM | POA: Diagnosis not present

## 2015-09-18 LAB — NITRIC OXIDE: Nitric Oxide: 41

## 2015-09-18 MED ORDER — FAMOTIDINE 20 MG PO TABS: ORAL_TABLET | ORAL | Status: AC

## 2015-09-18 MED ORDER — HYDROCODONE-HOMATROPINE 5-1.5 MG/5ML PO SYRP: ORAL_SOLUTION | ORAL | Status: DC

## 2015-09-18 MED ORDER — PANTOPRAZOLE SODIUM 40 MG PO TBEC: DELAYED_RELEASE_TABLET | ORAL | Status: DC

## 2015-09-18 NOTE — Telephone Encounter (Signed)
  No problem with the switch. Pls facilitate.  AD

## 2015-09-18 NOTE — Patient Instructions (Addendum)
It is nice to meet you today. Sips of water instead of cough/ throat clearing Sugar Free [employer] copyJolly Ranchers of lifesavers but no mint or menthol  Hydromet cough syrup for night time 1-2 tsp, for 3 nights and then try off Add pepcid ac 20 mg one 1 hours before bed and chlorpheniramine 4 mg x 2 one hour before bed (both are over the counter)  Do not drive if sleepy. Use the Delsym cough syrup during the day 2 tsp twice daily until no cough x one week, then try off    GERD (REFLUX)  is an extremely common cause of respiratory symptoms just like yours , many times with no obvious heartburn at all.    It can be treated with medication, but also with lifestyle changes including elevation of the head of your bed (ideally with 6 inch  bed blocks),  Smoking cessation, avoidance of late meals, excessive alcohol, and avoid fatty foods, chocolate, peppermint, colas, red wine, and acidic juices such as orange juice.  NO MINT OR MENTHOL PRODUCTS SO NO COUGH DROPS  USE SUGARLESS CANDY INSTEAD (Jolley ranchers or Stover's or Life Savers) or even ice chips will also do - the key is to swallow to prevent all throat clearing. NO OIL BASED VITAMINS - use powdered substitutes.   Please try to stop using chewing tobacco as it has a mint component. We will send in refills for Protonix 40 mg 30 min before 1st meal of the day  Blood Allergy Lab Work Today and we will call you with results  We will call you with results. Stop the Flonase, Singulair and Symbicort as they have not been effective  Follow up in 2 weeks with Dr. Christene Slatese Dios to consider an asthma challenge test  Please contact office for sooner follow up if symptoms do not improve or worsen or seek emergency care

## 2015-09-18 NOTE — Progress Notes (Signed)
History of Present Illness Anthony Coleman is a 40 y.o. male with multi factorial upper airway cough syndrome x 12 months, seen by Dr. Christene Slates.  Plan per Dr. Christene Slates on 08/21/2015  A. Upper Airway Cough Syndrome Plan to start Flonase 2 squirts per nostril at HS Plan to start Singulair 10 mg/tab, 1 tab at HS B. GERD Plan to start PPI 1 tab BID Advised on dietary changes.He eats a lot of tomato and pizza which can make GERD worse.  Try to keep HOB 30 degrees elevated. C. Asthma-type reaction, allergies Plan to start inhaled steroids/LABA > Symbicort 160/4.5 2P BID with a spacer. Use alb prn.  Plan to start prednisone at a lower dose of 20 mg/d x 7 d then 10 mg/d x 5-7 days.  Will consider neb meds if symbicort makes him cough.   Told wife to stop smoking at least for 1 week and see if cough gets better. Told pt to change his diet > avoid tomatoes, pizza, cheese, etc which can make GERD worse.  Told pt to call in 1 week if cough is not better. If he gets better, plan to taper off meds. May also try neb meds if symbicort makes him cough.  Try Delsym 1-2 teaspoon BID.   CXR in 02/2015 and 06/2015 has (-) infiltrate, some prominent int markings likely 2/2 bronchitis.   May need a chest ct scan if not better. R/O underlying parenchymal dse but less likely.  May need a bronch if not better.  Will need allergy test (RAST, allergy profile) if not better.   Hypersomnia Pt with hypersomnia, snoring, witnessed apneas per wife. Will need a PSG once cough is better.   Obesity Weight reduction.    09/18/2015 Follow Up Appointment: Pt. Presents for follow up of multifactorial cough 12 months in duration with frequent bouts of bronchitis.He states he implemented all of the treatments listed above per Dr. Tommi Rumps. Lucy Chris and that his cough is no better. He states it was initially better, but  as the weather has become hotter it has worsened, and is especially bad at night. He is sleeping at a 35 degree angle. He states when he is busy and active his cough is better, but as soon as he stops working or becomes less active he coughs.He completed his prednisone taper. He did not notice any significant improvement on the prednisone. He ran out of his Singulair and protonix and did not call for refills. He did not use the Delsym Cough Syrup, and states that the Symbicort is too expensive per his  Insurance plan. He denies chest pain, fever, purulent secretions , orthopnea or hemoptysis. Cough seems to trigger at end of expiration, beginning of inspiration. Dr. Sherene Sires assisted with this case, and felt it best to work on one potential problem at a time in order to best determine effectiveness of treatment plans. The patient was in agreement with this approach.   Tests 09/18/2015>> FENO= 41  Past medical hx Past Medical History  Diagnosis Date  . GERD (gastroesophageal reflux disease)      Past surgical hx, Family hx, Social hx all reviewed.  No current outpatient prescriptions on file prior to visit.   No current facility-administered medications on file prior to visit.     No Known Allergies  Review Of Systems:  Constitutional:   No  weight loss, night sweats,  Fevers, chills, fatigue, or  lassitude.  HEENT:   No headaches,  Difficulty swallowing,  Tooth/dental problems,  or  Sore throat,                No sneezing, itching, ear ache, nasal congestion, post nasal drip,   CV:  No chest pain,  Orthopnea, PND, swelling in lower extremities, anasarca, dizziness, palpitations, syncope.   GI  No heartburn, indigestion, abdominal pain, nausea, vomiting, diarrhea, change in bowel habits, loss of appetite, bloody stools.   Resp: No shortness of breath with exertion or at rest.  No excess mucus, no productive cough,  + non-productive cough,  No coughing up of blood.  No change in color of  mucus.  No wheezing.  No chest wall deformity  Skin: no rash or lesions.  GU: no dysuria, change in color of urine, no urgency or frequency.  No flank pain, no hematuria   MS:  No joint pain or swelling.  No decreased range of motion.  No back pain.  Psych:  No change in mood or affect. No depression or anxiety.  No memory loss.   Vital Signs BP 104/82 mmHg  Pulse 91  Ht 6' (1.829 m)  Wt 288 lb 3.2 oz (130.727 kg)  BMI 39.08 kg/m2  SpO2 97%   Physical Exam:  Wt Readings from Last 3 Encounters:  09/18/15 288 lb 3.2 oz (130.727 kg)  08/21/15 280 lb (127.007 kg)  08/14/15 283 lb 3 oz (128.453 kg)    Vital signs reviewed   General- No distress,  A&Ox3, pleasant but frustrated ENT: No sinus tenderness, TM clear, pale nasal mucosa, no oral exudate,no post nasal drip, no LAN Cardiac: S1, S2, regular rate and rhythm, no murmur Chest: No wheeze/ rales/ dullness; no accessory muscle use, no nasal flaring, no sternal retractions Note cough on inspiration  Abd.: Soft Non-tender Ext: No clubbing cyanosis, edema Neuro:  normal strength Skin: No rashes, warm and dry Psych: normal mood and behavior   I personally reviewed images and agree with radiology impression as follows:  CXR:  06/25/15 No acute pulmonary process.   Assessment/Plan  Cough No improvement on current regiment Cough worse at night Plan: Sips of water instead of cough/ throat clearing Sugar Free [employer] of [employer]. Hydromet cough syrup for night time  and then try off 1-2 tsp at bedtime  Add pepcid ac 20 mg one 1 hours before bed and chlorpheniramine 4 mg x 2 one hour before bed Do not drive if sleepy. Use the Delsym cough syrup during the day 2 tsp every 12 hours  GERD Diet Avoid mint, menthol and chocolate Please try to stop using chewing tobacco as it has a mint component. We will send in refills for Protonix 40 mg 30 min before 1st meal of the day  Blood Allergy Lab Work Today in the  office. We will call you with results. Stop the Flonase, Singulair and Symbicort. Follow up in 2 weeks with Dr. Christene Slates Please contact office for sooner follow up if symptoms do not improve or worsen or seek emergency care         Bevelyn Ngo, NP 09/18/2015  1:24 PM    I saw and examined this pt independently - he has not responded convincingly to rx for asthma including ICS/ singular and prednisone and has  An indeterminate level of FENO and a cough on insp with harsh barking features typical of  Classic Upper airway cough syndrome, so named because it's frequently impossible to sort out how much is  CR/sinusitis with freq throat clearing (which can  be related to primary GERD)   vs  causing  secondary (" extra esophageal")  GERD from wide swings in gastric pressure that occur with throat clearing, often  promoting self use of mint and menthol lozenges that reduce the lower esophageal sphincter tone and exacerbate the problem further in a cyclical fashion.   These are the same pts (now being labeled as having "irritable larynx syndrome" by some cough centers) who not infrequently have a history of having failed to tolerate ace inhibitors,  dry powder inhalers or biphosphonates or report having atypical reflux symptoms that don't respond to standard doses of PPI , and are easily confused as having aecopd or asthma flares by even experienced allergists/ pulmonologists.    rec max gerd rx/ elimination of pnds with 1st gen h1 at hs, proceed with allergy profile  and hold asthma rx x 2 weeks and if not improving then MCT next step in the w/u.  The standardized cough guidelines published in Chest by Stark Fallsichard Irwin in 2006 are still the best available and consist of a multiple step process (up to 12!) , not a single office visit,  and are intended  to address this problem logically,  with an alogrithm dependent on response to empiric treatment at  each progressive step  to determine a specific  diagnosis with  minimal addtional testing needed. Therefore if adherence is an issue or can't be accurately verified,  it's very unlikely the standard evaluation and treatment will be successful here.    Furthermore, response to therapy (other than acute cough suppression, which should only be used short term with avoidance of narcotic containing cough syrups if possible), can be a gradual process for which the patient is not likely to  perceive immediate benefit.  Need to do a trust but verify approach at each step  In the process before considering the next step  I had an extended discussion with the patient reviewing all relevant studies completed to date and  lasting 25 minutes of a 40  Minute   outpt visit  For refractory chronic cough   Each maintenance medication was reviewed in detail including most importantly the difference between maintenance and prns and under what circumstances the prns are to be triggered using an action plan format that is not reflected in the computer generated alphabetically organized AVS.    Please see instructions for details which were reviewed in writing and the patient given a copy highlighting the part that I personally wrote and discussed at today's ov.     Sandrea HughsMichael Wert, MD Pulmonary and Critical Care Medicine [facility] Cell [phone] After 5:30 PM or weekends, call [phone]

## 2015-09-18 NOTE — Telephone Encounter (Signed)
Pt seen by Kandice RobinsonsSarah Groce and Dr Sherene SiresWert today fore cough Pt requesting to switch from Dr Christene Slatesde Dios to Dr Sherene SiresWert to follow his cough. Dr Sherene SiresWert has already agreed to this. Patient will need to be set up for a 2 week follow up once switch is complete.   Please advise Dr Christene Slatesde Dios. Thanks.

## 2015-09-18 NOTE — Telephone Encounter (Signed)
lmtcb X1 for pt to schedule rov with MW.

## 2015-09-18 NOTE — Telephone Encounter (Signed)
Patient called back and was advised okay to see Dr. Sherene SiresWert and needed to schedule a follow up appointment.  This was made for 10/16/2015 - MW first available.  Nothing further needed per the patient.

## 2015-09-18 NOTE — Assessment & Plan Note (Signed)
No improvement on current regiment Cough worse at night Plan: Sips of water instead of cough/ throat clearing Sugar Free Coca-ColaJolly Ranchers of [employer] Group[employer]emon [employer]. Hydromet cough syrup for night time  and then try off 1-2 tsp at bedtime  Add pepcid ac 20 mg one 1 hours before bed and chlorpheniramine 4 mg x 2 one hour before bed Do not drive if sleepy. Use the Delsym cough syrup during the day 2 tsp every 12 hours  GERD Diet Avoid mint, menthol and chocolate Please try to stop using chewing tobacco as it has a mint component. We will send in refills for Protonix 40 mg 30 min before 1st meal of the day  Blood Allergy Lab Work Today in the office. We will call you with results. Stop the Flonase, Singulair and Symbicort. Follow up in 2 weeks with Dr. Christene Slatese Dios Please contact office for sooner follow up if symptoms do not improve or worsen or seek emergency care

## 2015-09-19 LAB — RESPIRATORY ALLERGY PROFILE REGION II ~~LOC~~
Allergen, D pternoyssinus,d7: 0.95 kU/L — ABNORMAL HIGH
Allergen, Mulberry, t76: <0.1 kU/L
Alternaria Alternata: <0.1 kU/L
Box Elder IgE: <0.1 kU/L
Cat Dander: <0.1 kU/L
Cladosporium Herbarum: <0.1 kU/L
Cockroach: <0.1 kU/L
D. farinae: 1.16 kU/L — ABNORMAL HIGH
DOG DANDER: 0.3 kU/L — AB
IGE (IMMUNOGLOBULIN E), SERUM: 23 kU/L (ref <115)
Johnson Grass: <0.1 kU/L
Pecan/Hickory Tree IgE: <0.1 kU/L
Penicillium Notatum: <0.1 kU/L
Rough Pigweed  IgE: <0.1 kU/L
Sheep Sorrel IgE: <0.1 kU/L
Timothy Grass: <0.1 kU/L

## 2015-09-22 ENCOUNTER — Institutional Professional Consult (permissible substitution): Payer: BLUE CROSS/BLUE SHIELD | Admitting: Pulmonary Disease

## 2015-10-16 ENCOUNTER — Ambulatory Visit (INDEPENDENT_AMBULATORY_CARE_PROVIDER_SITE_OTHER): Payer: BLUE CROSS/BLUE SHIELD | Admitting: Internal Medicine

## 2015-10-16 ENCOUNTER — Encounter: Payer: Self-pay | Admitting: Internal Medicine

## 2015-10-16 VITALS — BP 116/72 | HR 89 | Ht 72.0 in | Wt 284.6 lb

## 2015-10-16 DIAGNOSIS — R05 Cough: Secondary | ICD-10-CM

## 2015-10-16 DIAGNOSIS — R059 Cough, unspecified: Secondary | ICD-10-CM

## 2015-10-16 MED ORDER — MOMETASONE FURO-FORMOTEROL FUM 100-5 MCG/ACT IN AERO: 2.0000 | INHALATION_SPRAY | Freq: Two times a day (BID) | RESPIRATORY_TRACT | 0 refills | Status: AC

## 2015-10-16 MED ORDER — PREDNISONE 10 MG PO TABS: ORAL_TABLET | ORAL | 0 refills | Status: AC

## 2015-10-16 MED ORDER — OMEPRAZOLE MAGNESIUM 20 MG PO TBEC
20.0000 mg | DELAYED_RELEASE_TABLET | Freq: Every day | ORAL | Status: AC
Start: 2015-10-16 — End: ?

## 2015-10-16 NOTE — Patient Instructions (Addendum)
For cough > delsym 2 tsp every 12 hours until no coughing at all for a week then just take as needed   Prednisone 10 mg take  4 each am x 2 days,   2 each am x 2 days,  1 each am x 2 days and stop   Dulera 100 Take 2 puffs first thing in am and then another 2 puffs about 12 hours later perfectly regularly   Continue  pepcid ac 20 mg one 1 hours before bed and chlorpheniramine 4 mg x 2 one hour before bed (both are over the counter)  And add prilosec otc  20 mg Take 30-60 min before first meal of the day   Please schedule a follow up office visit in 4 weeks, sooner if needed bring all active medication with you

## 2015-10-16 NOTE — Assessment & Plan Note (Signed)
Onset summer 2016 with mixed features per cough survey 10/16/2015  Allergy profile 7/23 /2017 >    IgE  23 Dust > dog 09/18/2015>> FENO= 41 rec trial of gerd / h1 and h2 hs > non adherent -  10/16/2015  After extensive coaching HFA effectiveness =    75% s spacer > try dulera 100 2bid plus max rx for gerd x 4 weeks then ov   I had an extended discussion with the patient reviewing all relevant studies completed to date and  lasting 15 to 20 minutes of a 25 minute visit on the following ongoing concerns:   The standardized cough guidelines published in Chest by [employer] Fallsich[facility] Irwin in 2006 are still the best available and consist of a multiple step process (up to 12!) , not a single office visit,  and are intended  to address this problem logically,  with an alogrithm dependent on response to empiric treatment at  each progressive step  to determine a specific diagnosis with  minimal addtional testing needed. Therefore if adherence is an issue or can't be accurately verified,  it's very unlikely the standard evaluation and treatment will be successful here.    Furthermore, response to therapy (other than acute cough suppression, which should only be used short term with avoidance of narcotic containing cough syrups if possible), can be a gradual process for which the patient is not likely to  perceive immediate benefit.  Unlike going to an eye doctor where the best perscription is almost always the first one and is immediately effective, this is almost never the case in the management of chronic cough syndromes. Therefore the patient needs to commit up front to consistently adhere to recommendations  for up to 6 weeks of therapy directed at the likely underlying problem(s) before the response can be reasonably evaluated.   Noct cough does suggest cough variant asthma since says using h2h1 hs but need to use a trust but verify approach here > rec bring all meds to each ov and f/u for now q 4 weeks

## 2015-10-16 NOTE — Progress Notes (Signed)
History of Present Illness 84 yowm never smoker never allergies onset of cough summer 2016   eval Dr. Christene Slates on 08/21/2015  A. Upper Airway Cough Syndrome Plan to start Flonase 2 squirts per nostril at HS Plan to start Singulair 10 mg/tab, 1 tab at HS B. GERD Plan to start PPI 1 tab BID Advised on dietary changes.He eats a lot of tomato and pizza which can make GERD worse.  Try to keep HOB 30 degrees elevated. C. Asthma-type reaction, allergies Plan to start inhaled steroids/LABA > Symbicort 160/4.5 2P BID with a spacer. Use alb prn.  Plan to start prednisone at a lower dose of 20 mg/d x 7 d then 10 mg/d x 5-7 days.  Will consider neb meds if symbicort makes him cough.   Told wife to stop smoking at least for 1 week and see if cough gets better. Told pt to change his diet > avoid tomatoes, pizza, cheese, etc which can make GERD worse.  If he gets better, plan to taper off meds. May also try neb meds if symbicort makes him cough.  Try Delsym 1-2 teaspoon BID.        09/18/2015 NP  Follow Up Appointment: Pt. Presents for follow up of multifactorial cough 12 months in duration with frequent bouts of bronchitis.He states he implemented all of the treatments listed above per Dr. Tommi Rumps. Lucy Chris and that his cough is no better. He states it was initially better, but as the weather has become hotter it has worsened, and is especially bad at night. He is sleeping at a 35 degree angle. He states when he is busy and active his cough is better, but as soon as he stops working or becomes less active he coughs.He completed his prednisone taper. He did not notice any significant improvement on the prednisone. He ran out of his Singulair and protonix and did not call for refills. He did not use the Delsym Cough Syrup, and states that the Symbicort is too expensive per his  Insurance plan. He denies  chest pain, fever, purulent secretions , orthopnea or hemoptysis. Cough seems to trigger at end of expiration, beginning of inspiration. Dr. Sherene Sires assisted with this case, and felt it best to work on one potential problem at a time in order to best determine effectiveness of treatment plans. The patient was in agreement with this approach.  Tests 09/18/2015>> FENO= 41 rec   Sips of water instead of cough/ throat clearing Sugar Free Coca[employer] of lifesavers but no mint or menthol  Hydromet cough syrup for night time 1-2 tsp, for 3 nights and then try off Add pepcid ac 20 mg one 1 hours before bed and chlorpheniramine 4 mg x 2 one hour before bed (both are over the counter)  Do not drive if sleepy. Use the Delsym cough syrup during the day 2 tsp twice daily until no cough x one week, then try off   GERD diet  Please try to stop using chewing tobacco as it has a mint component. We will send in refills for Protonix 40 mg 30 min before 1st meal of the day  Blood Allergy Lab Work Today and we will call you with results   Stop the Flonase, Singulair and Symbicort as they have not been effective    10/16/2015  f/u ov/Tyechia Allmendinger re:  Cough x 12 months  On pepcid at hs only  Chief Complaint  Patient presents with  . Follow-up    Cough has improved  some, but has not resolved. He has occ cough with minimal sputum- "I don't look at it I don't know what color".        Kouffman Reflux v Neurogenic Cough Differentiator Reflux Comments  Do you awaken from a sound sleep coughing violently?                            With trouble breathing? 3-4 in am every night to point of losing breath    Do you have choking episodes when you cannot  Get enough air, gasping for air ?              Yes/ gagging   Do you usually cough when you lie down into  The bed, or when you just lie down to rest ?                          Yes not bad   Do you usually cough after meals or eating?         Less now   Do you cough when  (or after) you bend over?    no   GERD SCORE     Kouffman Reflux v Neurogenic Cough Differentiator Neurogenic   Do you more-or-less cough all day long? sporadic   Does change of temperature make you cough? Hot air   Does laughing or chuckling cause you to cough? no   Do fumes (perfume, automobile fumes, burned  Toast, etc.,) cause you to cough ?      Yes some   Does speaking, singing, or talking on the phone cause you to cough   ?               Singing yes   Neurogenic/Airway score       The cough noct is the greatest concern but sporadic daytime, not adherent so far with any of the recs including cough suppression.  Not limited by breathing from desired activities    No obvious day to day or daytime variability or assoc excess/ purulent sputum or mucus plugs or hemoptysis or cp or chest tightness, subjective wheeze or overt sinus or hb symptoms. No unusual exp hx or h/o childhood pna/ asthma or knowledge of premature birth.  Sleeping ok without nocturnal  or early am exacerbation  of respiratory  c/o's or need for noct saba. Also denies any obvious fluctuation of symptoms with weather or environmental changes or other aggravating or alleviating factors except as outlined above   Current Medications, Allergies, Complete Past Medical History, Past Surgical History, Family History, and Social History were reviewed in [employer] record.  ROS  The following are not active complaints unless bolded sore throat, dysphagia, dental problems, itching, sneezing,  nasal congestion or excess/ purulent secretions, ear ache,   fever, chills, sweats, unintended wt loss, classically pleuritic or exertional cp,  orthopnea pnd or leg swelling, presyncope, palpitations, abdominal pain, anorexia, nausea, vomiting, diarrhea  or change in bowel or bladder habits, change in stools or urine, dysuria,hematuria,  rash, arthralgias, visual complaints, headache, numbness, weakness or ataxia or  problems with walking or coordination,  change in mood/affect or memory.              Physical Exam:   amb wm nad/ somber    10/16/2015         09/18/15 288 lb 3.2 oz (130.727 kg)  08/21/15 280  lb (127.007 kg)  08/14/15 283 lb 3 oz (128.453 kg)    Vital signs reviewed   General- No distress,  A&Ox3, pleasant but frustrated ENT: No sinus tenderness, TM clear, pale nasal mucosa, no oral exudate,no post nasal drip, no LAN Cardiac: S1, S2, regular rate and rhythm, no murmur Chest: clear to A and P with variable cough on both insp and late exp  Abd.: Soft Non-tender Ext: No clubbing cyanosis, edema Neuro:  normal strength Skin: No rashes, warm and dry Psych: normal mood and behavior     Assessment/Plan

## 2015-11-13 ENCOUNTER — Ambulatory Visit: Payer: BLUE CROSS/BLUE SHIELD | Admitting: Internal Medicine

## 2016-05-16 IMAGING — CR DG CHEST 2V
2 series · 2 of 2 positions shown · non-contrast
Comparison: None.

CLINICAL DATA: Acute onset of cough, congestion and fever. Initial
encounter.

EXAM:
CHEST  2 VIEW

[w chest pa *]
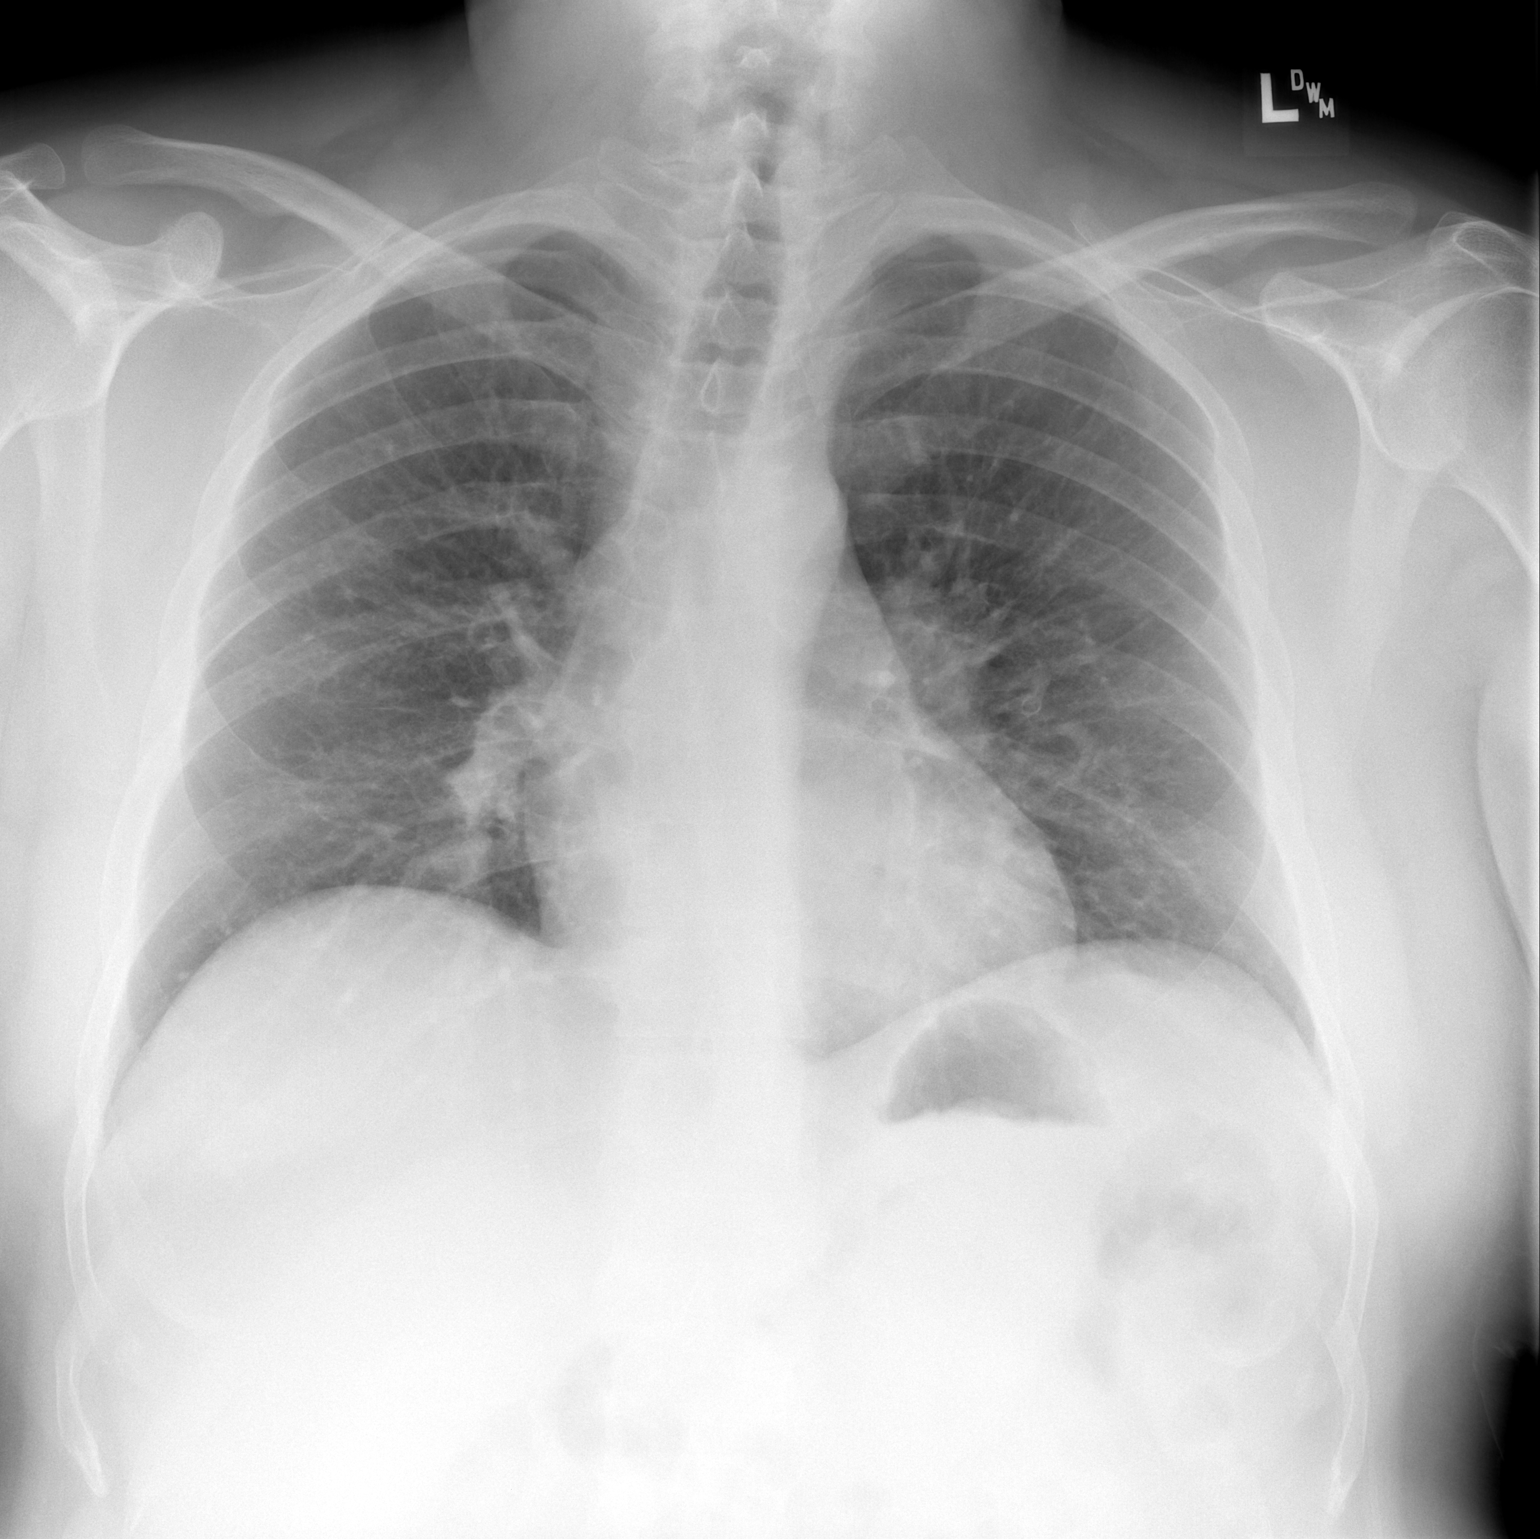

[w chest lat *]
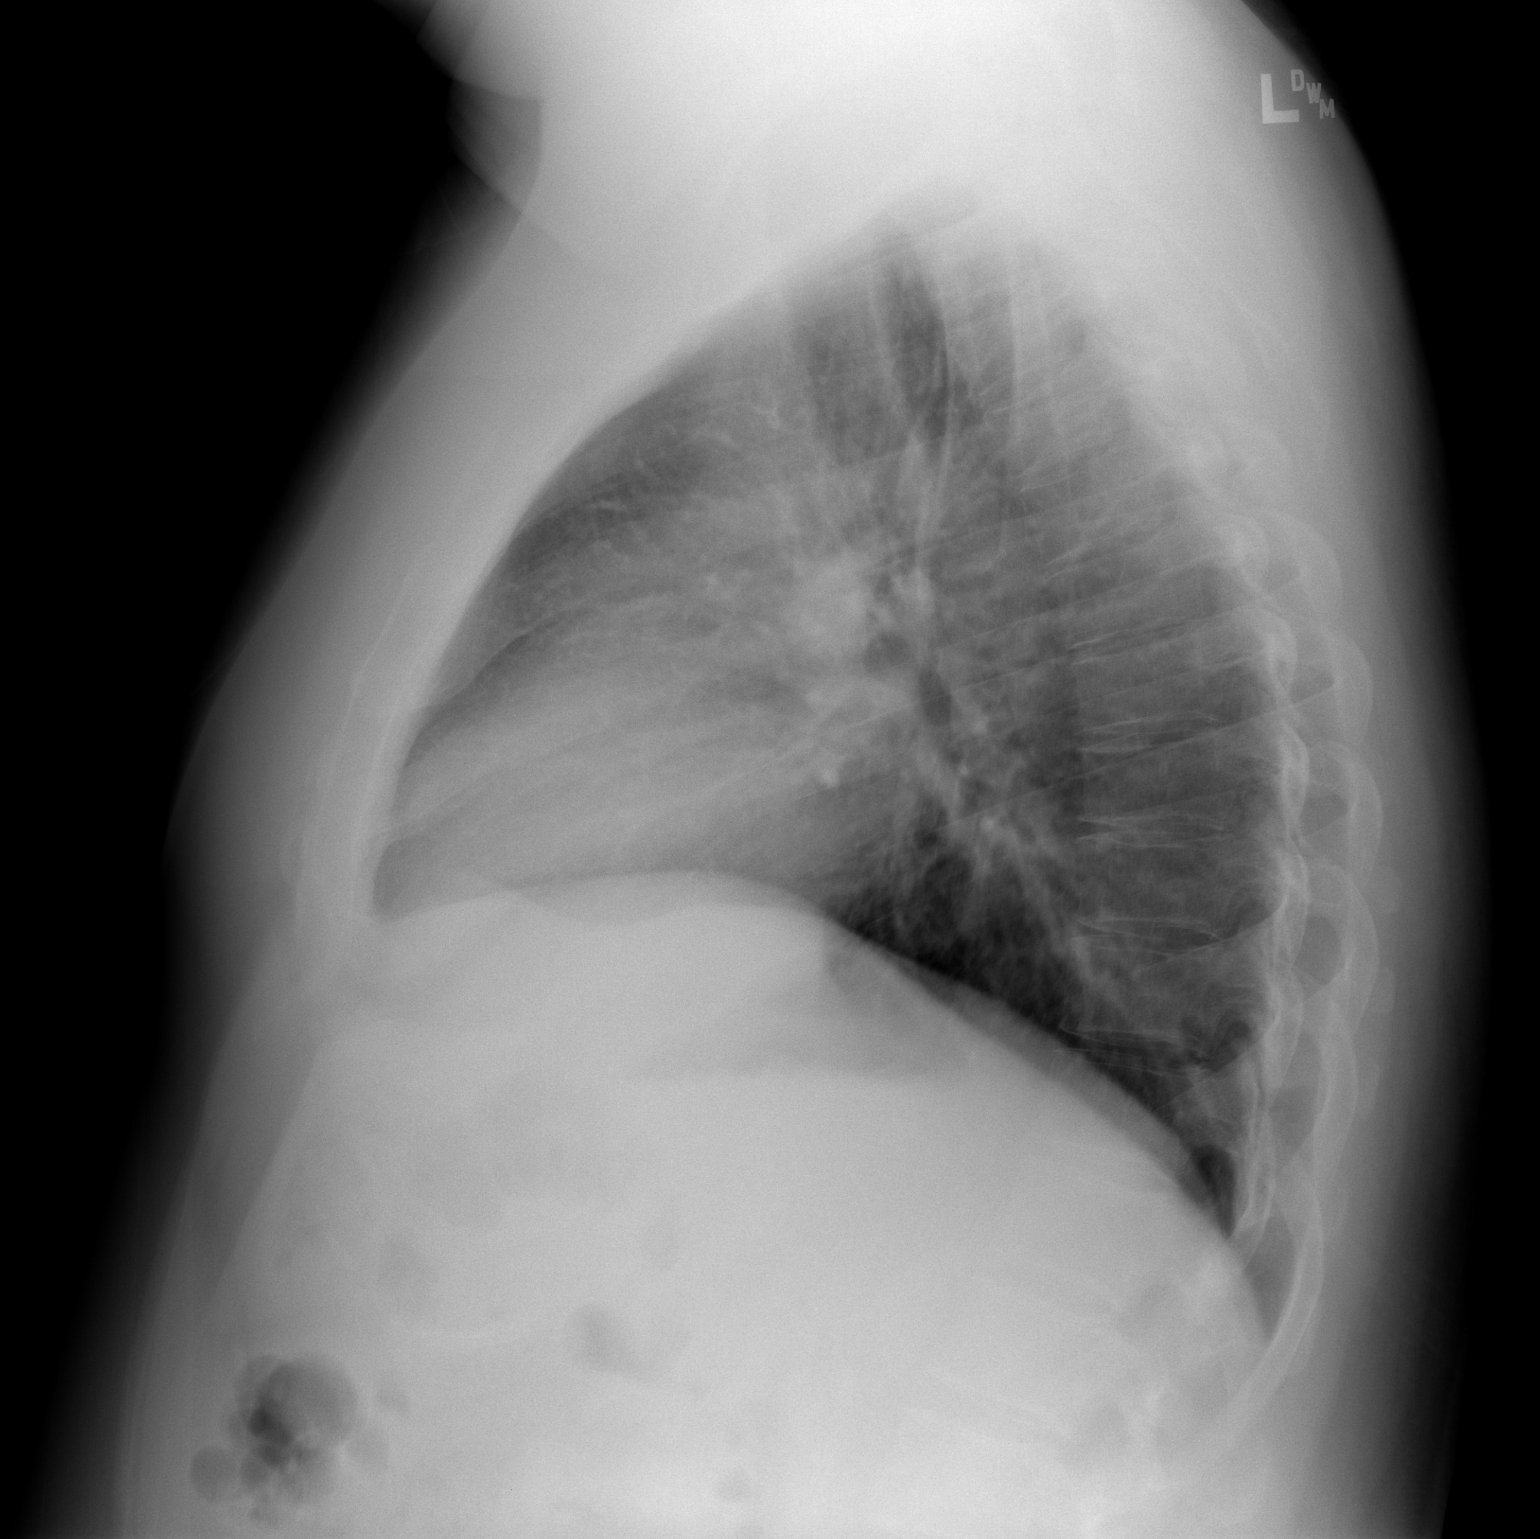

[2 of 2 positions shown; findings below may reference images not displayed]

FINDINGS: The lungs are well-aerated and clear. There is no evidence of focal
opacification, pleural effusion or pneumothorax.

The heart is normal in size; the mediastinal contour is within
normal limits. No acute osseous abnormalities are seen.
IMPRESSION: No acute cardiopulmonary process seen.

## 2016-09-07 IMAGING — CR DG CHEST 2V
2 series · 2 of 2 positions shown · non-contrast
Comparison: 03/05/2015

CLINICAL DATA: Chest pain and shortness of breath for 1 day.

EXAM:
CHEST  2 VIEW

[chest pa]
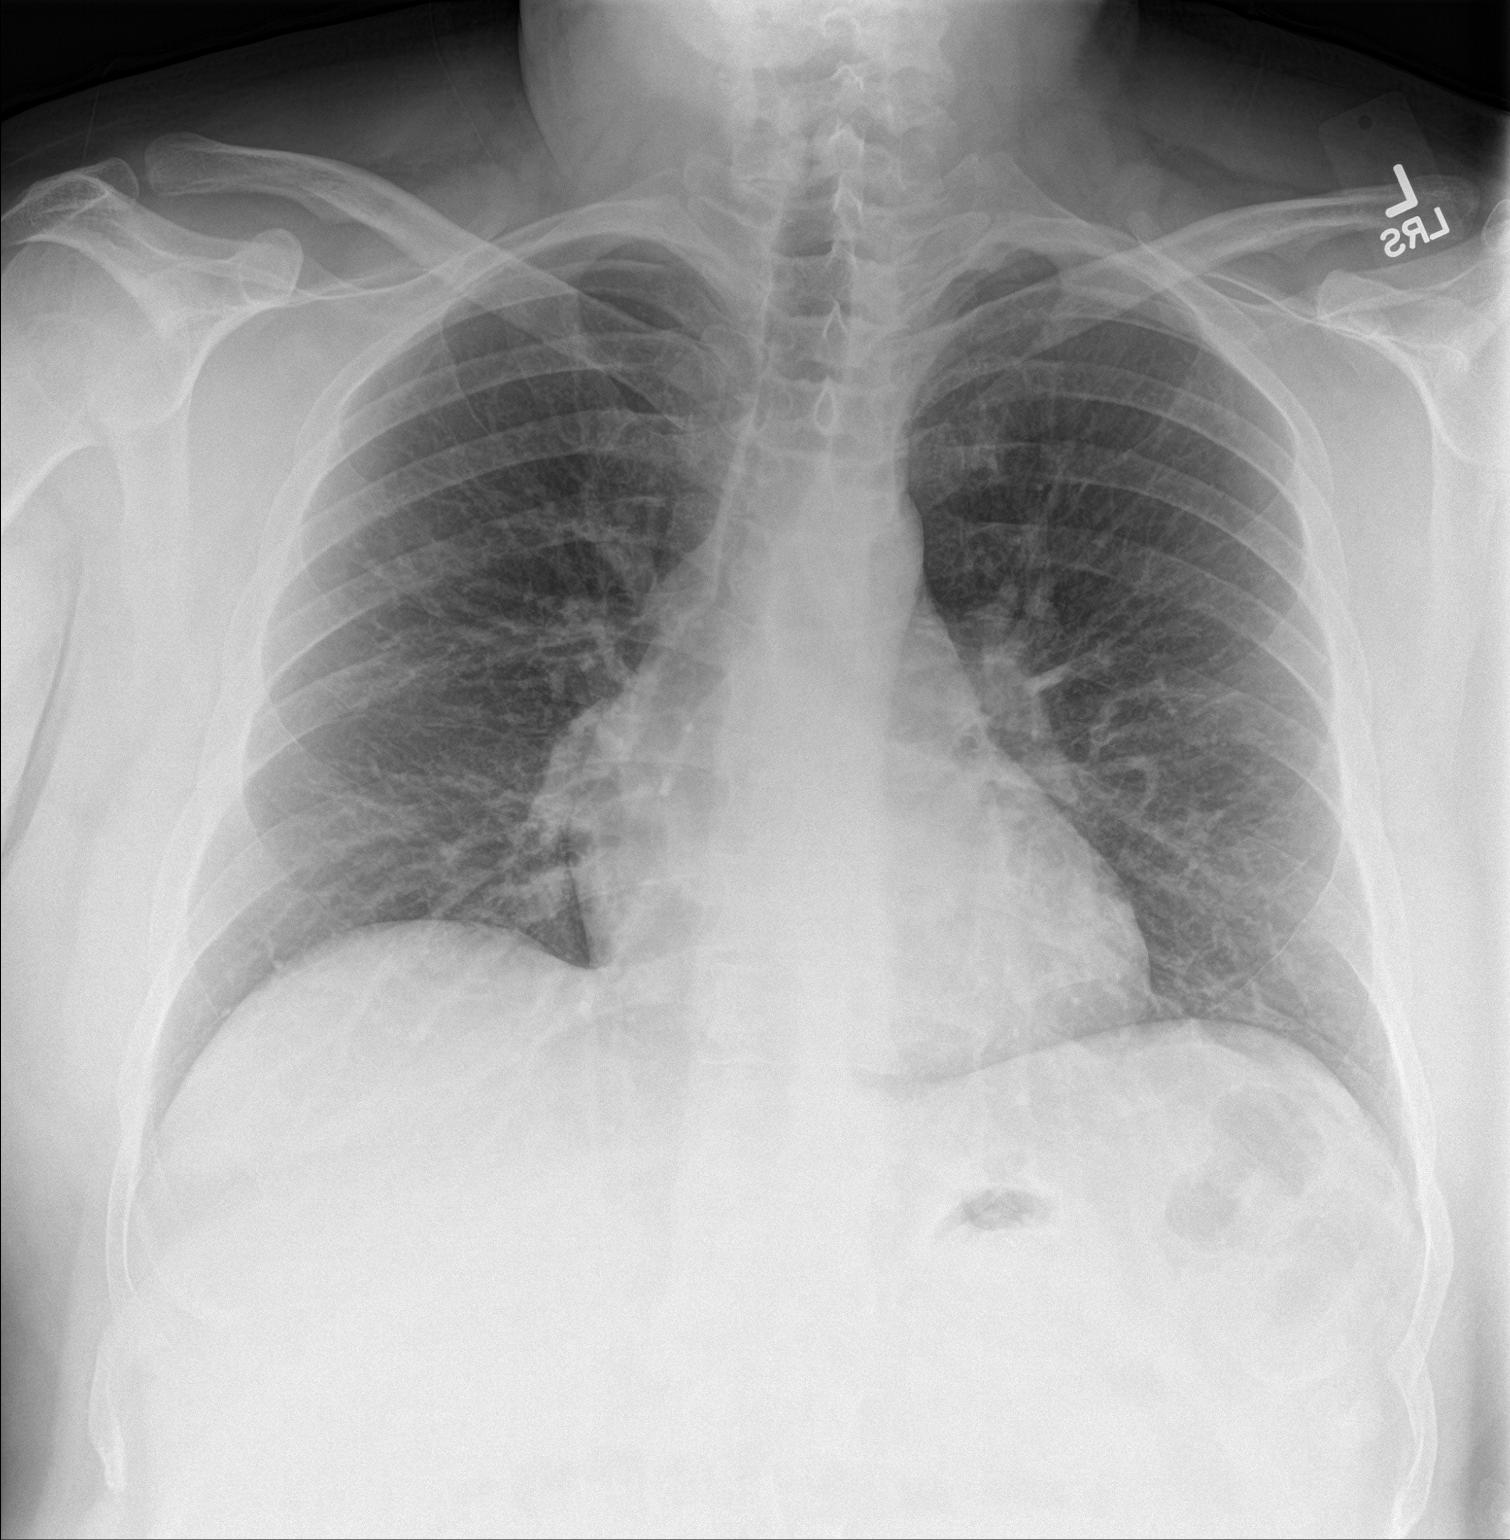

[chest lat]
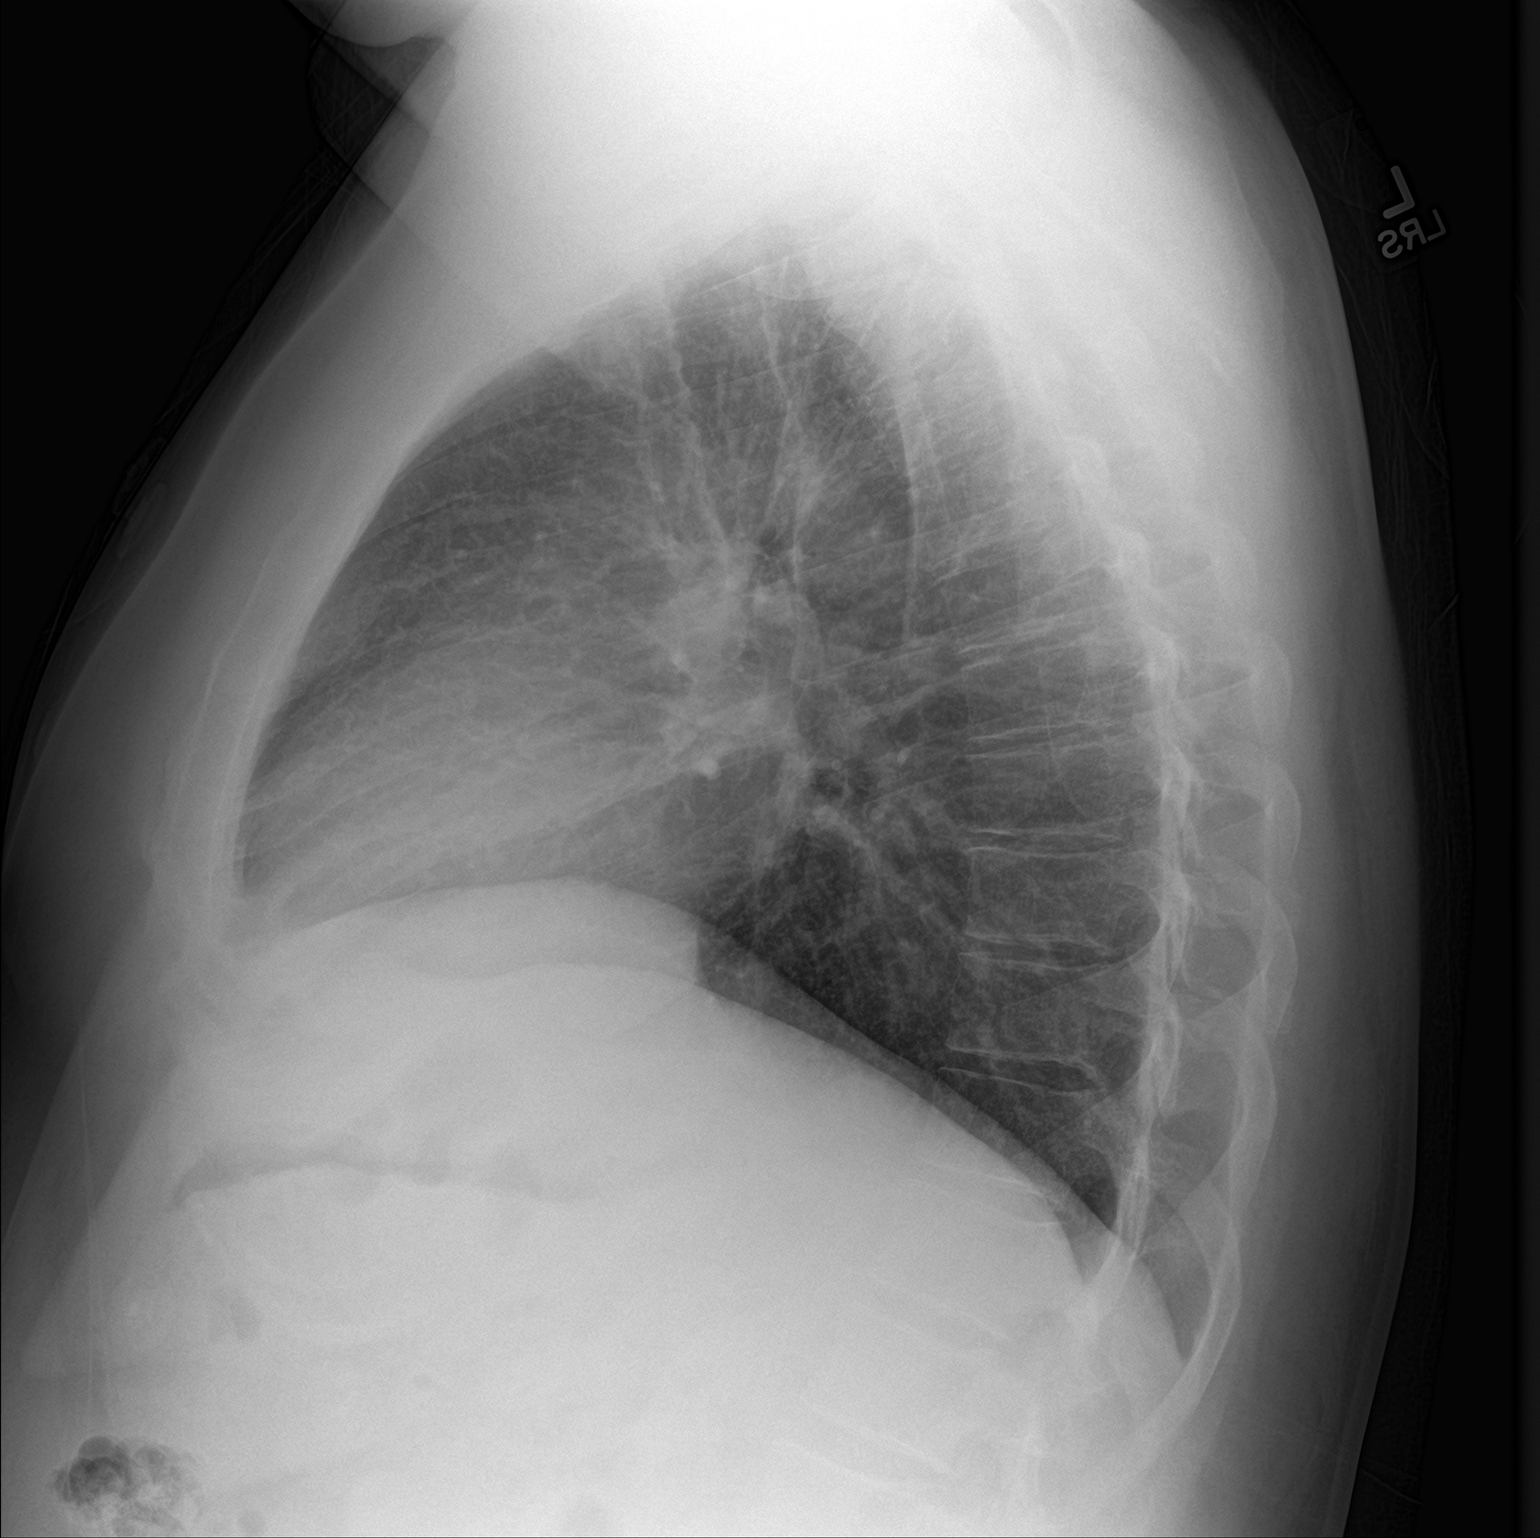

[2 of 2 positions shown; findings below may reference images not displayed]

FINDINGS: The cardiomediastinal contours are normal. The lungs are clear.
Pulmonary vasculature is normal. No consolidation, pleural effusion,
or pneumothorax. No acute osseous abnormalities are seen.
IMPRESSION: No acute pulmonary process.

## 2018-03-08 ENCOUNTER — Encounter: Payer: Self-pay | Admitting: Emergency Medicine

## 2018-03-08 ENCOUNTER — Ambulatory Visit
Admission: EM | Admit: 2018-03-08 | Discharge: 2018-03-08 | Disposition: A | Discharge status: Home / Self Care | Payer: BLUE CROSS/BLUE SHIELD | Attending: Family Medicine | Admitting: Family Medicine

## 2018-03-08 DIAGNOSIS — J0100 Acute maxillary sinusitis, unspecified: Secondary | ICD-10-CM

## 2018-03-08 MED ORDER — AMOXICILLIN-POT CLAVULANATE 875-125 MG PO TABS: 1.0000 | ORAL_TABLET | Freq: Two times a day (BID) | ORAL | 0 refills | Status: AC

## 2018-03-08 MED ORDER — IPRATROPIUM BROMIDE 0.03 % NA SOLN: 2.0000 | Freq: Three times a day (TID) | NASAL | 0 refills | Status: AC

## 2018-03-08 NOTE — ED Triage Notes (Signed)
Pt presents to [facility] for assessment of 1 week of nasal congestion, pressure, headaches, post-nasal drip.  States "I need an antibiotic for a sinus infection".

## 2018-03-08 NOTE — ED Notes (Signed)
Patient able to ambulate independently  

## 2018-03-08 NOTE — ED Provider Notes (Signed)
[facility]    CSN: [id number] Arrival date & time: 03/08/18  1534     History   Chief Complaint Chief Complaint  Patient presents with  . Nasal Congestion    HPI Anthony Glassmanhillip Lewellyn Jr. is a 43 y.o. male.   HPI  Sinusitis: Patient presents with sinusitis. The patient reports chronic sinus infections for 10 days.  Her symptoms include nasal congestion, cough, headaches, facial pain, puffiness of the eyes. There has not been a history of nasal congestion, purulent rhinorrhea, sneezing, headaches, facial pain, puffiness of the eyes. Other medications have included Flonase without relief. He reports a persistent headache and facial pressure. He has not taken any medication for symptoms. He denies history of hypertension although BP is elevated today. He denies symptoms of chest pain, shortness of breath, or dizziness.   Past Medical History:  Diagnosis Date  . GERD (gastroesophageal reflux disease)     Patient Active Problem List   Diagnosis Date Noted  . Hypersomnia 08/21/2015  . Obesity 08/21/2015  . Cough 03/26/2015  . Snoring 03/26/2015    History reviewed. No pertinent surgical history.     Home Medications    Prior to Admission medications   Medication Sig Start Date End Date Taking? Authorizing Provider  amoxicillin-clavulanate (AUGMENTIN) 875-125 MG tablet Take 1 tablet by mouth 2 (two) times daily. 03/08/18   Bing NeighborsHarris, Mohini Heathcock S, FNP  famotidine (PEPCID) 20 MG tablet One at bedtime 09/18/15   Nyoka CowdenWert, Michael B, MD  ipratropium (ATROVENT) 0.03 % nasal spray Place 2 sprays into both nostrils 3 (three) times daily. 03/08/18   Bing NeighborsHarris, Alaa Eyerman S, FNP  mometasone-formoterol (DULERA) 100-5 MCG/ACT AERO Inhale 2 puffs into the lungs 2 (two) times daily. 10/16/15   Nyoka CowdenWert, Michael B, MD  omeprazole (PRILOSEC OTC) 20 MG tablet Take 1 tablet (20 mg total) by mouth daily. 10/16/15   Nyoka CowdenWert, Michael B, MD  predniSONE (DELTASONE) 10 MG tablet Take  4 each am x 2 days,   2 each am x  2 days,  1 each am x 2 days and stop 10/16/15   Nyoka CowdenWert, Michael B, MD    Family History Family History  Problem Relation Age of Onset  . Alcohol abuse Mother   . Alcohol abuse Father   . Diabetes Maternal Grandmother   . Diabetes Maternal Grandfather     Social History Social History   Tobacco Use  . Smoking status: Never Smoker  . Smokeless tobacco: Current User    Types: Snuff  Substance Use Topics  . Alcohol use: No  . Drug use: No     Allergies   Patient has no known allergies.   Review of Systems Review of Systems Pertinent negatives listed in HPI Physical Exam Triage Vital Signs ED Triage Vitals [03/08/18 1543]  Enc Vitals Group     BP (!) 162/98     Pulse Rate 69     Resp 18     Temp (!) 97.3 F (36.3 C)     Temp Source Oral     SpO2 98 %     Weight      Height      Head Circumference      Peak Flow      Pain Score 8     Pain Loc      Pain Edu?      Excl. in GC?    No data found.  Updated Vital Signs BP (!) 162/98 (BP Location: Left Arm)   Pulse  69   Temp (!) 97.3 F (36.3 C) (Oral)   Resp 18   SpO2 98%   Visual Acuity Right Eye Distance:   Left Eye Distance:   Bilateral Distance:    Right Eye Near:   Left Eye Near:    Bilateral Near:     Physical Exam General Appearance:    Alert, cooperative, no distress  HENT:  ENT: R & L ear normal,  no neck nodes, throat normal without erythema or exudate, right maxiallary sinus tender, post nasal drip noted and nasal mucosa congested  Eyes:    Right eye puffiness and sclera red, PERRL, EOM's intact       Lungs:     Clear to auscultation bilaterally, respirations unlabored  Heart:    Regular rate and rhythm  Neurologic:   Awake, alert, oriented x 3. No apparent focal neurological           defect.     UC Treatments / Results  Labs (all labs ordered are listed, but only abnormal results are displayed) Labs Reviewed - No data to display  EKG None  Radiology No results  found.  Procedures Procedures (including critical care time)  Medications Ordered in UC Medications - No data to display  Initial Impression / Assessment and Plan / UC Course  I have reviewed the triage vital signs and the nursing notes.  Pertinent labs & imaging results that were available during my care of the patient were reviewed by me and considered in my medical decision making (see chart for details).   Patient presents with symptoms consistent of acute sinusitis. Treating empirically with antibiotic therapy. Patient has acute right eye redness and edema. Cautioned regarding signs and symptoms of preorbital cellulitis. Strict return precautions given. Patient verbalized understanding.  Final Clinical Impressions(s) / UC Diagnoses   Final diagnoses:  Acute maxillary sinusitis, recurrence not specified   Discharge Instructions   None    ED Prescriptions    Medication Sig Dispense Auth. Provider   amoxicillin-clavulanate (AUGMENTIN) 875-125 MG tablet Take 1 tablet by mouth 2 (two) times daily. 20 tablet Bing NeighborsHarris, Anselma Herbel S, FNP   ipratropium (ATROVENT) 0.03 % nasal spray Place 2 sprays into both nostrils 3 (three) times daily. 30 mL Bing NeighborsHarris, Yandell Mcjunkins S, FNP     Controlled Substance Prescriptions [place] Controlled Substance Registry consulted? Not Applicable   Bing NeighborsHarris, Rishita Petron S, FNP 03/08/18 1640
